# Patient Record
Sex: Female | Born: 1990
Health system: Southern US, Community
[De-identification: ages and names within clinical notes are randomized; demographics above are authoritative.]

## PROBLEM LIST (undated history)

## (undated) DIAGNOSIS — A749 Chlamydial infection, unspecified: Secondary | ICD-10-CM

## (undated) DIAGNOSIS — B999 Unspecified infectious disease: Secondary | ICD-10-CM

## (undated) DIAGNOSIS — F419 Anxiety disorder, unspecified: Secondary | ICD-10-CM

## (undated) DIAGNOSIS — L309 Dermatitis, unspecified: Secondary | ICD-10-CM

## (undated) HISTORY — PX: NO PAST SURGERIES: SHX2092

---

## 2012-02-05 ENCOUNTER — Emergency Department (INDEPENDENT_AMBULATORY_CARE_PROVIDER_SITE_OTHER)
Admission: EM | Admit: 2012-02-05 | Discharge: 2012-02-05 | Disposition: A | Discharge status: Home / Self Care | Payer: PRIVATE HEALTH INSURANCE | Source: Home / Self Care | Attending: Emergency Medicine | Admitting: Emergency Medicine

## 2012-02-05 ENCOUNTER — Encounter (HOSPITAL_COMMUNITY): Payer: Self-pay | Admitting: Emergency Medicine

## 2012-02-05 DIAGNOSIS — N390 Urinary tract infection, site not specified: Secondary | ICD-10-CM

## 2012-02-05 LAB — POCT URINALYSIS DIP (DEVICE)
Glucose, UA: NEGATIVE mg/dL
Protein, ur: >=300 mg/dL — AB
Specific Gravity, Urine: >=1.03 (ref 1.005–1.030)

## 2012-02-05 LAB — POCT PREGNANCY, URINE: Preg Test, Ur: NEGATIVE

## 2012-02-05 MED ORDER — FLUCONAZOLE 150 MG PO TABS: 150.0000 mg | ORAL_TABLET | Freq: Once | ORAL | Status: DC

## 2012-02-05 MED ORDER — NITROFURANTOIN MONOHYD MACRO 100 MG PO CAPS: 100.0000 mg | ORAL_CAPSULE | Freq: Two times a day (BID) | ORAL | Status: DC

## 2012-02-05 NOTE — ED Notes (Addendum)
Pt is here for poss UTI x1 day.... Sx include: dysuria, cloudy urine w/foul odor, hematuria x1 day, dizziness, nauseas and also c/o clear vaginal discharge... Denies: fevers, vomiting, diarrhea, abd/back pain... LMP on 02/02/12... She is alert w/no signs of acute distress.

## 2012-02-05 NOTE — ED Provider Notes (Signed)
History     CSN: [id number]  Arrival date & time 02/05/12  1016   None     Chief Complaint  Patient presents with  . Urinary Tract Infection    (Consider location/radiation/quality/duration/timing/severity/associated sxs/prior treatment) Patient is a 21 y.o. female presenting with urinary tract infection. The history is provided by the patient. No language interpreter was used.  Urinary Tract Infection This is a new problem. The current episode started more than 2 days ago. The problem occurs constantly. The problem has been gradually worsening. Pertinent negatives include no abdominal pain. Nothing aggravates the symptoms. Nothing relieves the symptoms. She has tried nothing for the symptoms.  Pt complains of burning with urination and blood in urine.  Pt also complains of a yeast infection.  No std risk  History reviewed. No pertinent past medical history.  History reviewed. No pertinent past surgical history.  No family history on file.  History  Substance Use Topics  . Smoking status: Never Smoker   . Smokeless tobacco: Not on file  . Alcohol Use: No    OB History    Grav Para Term Preterm Abortions TAB SAB Ect Mult Living                  Review of Systems  Gastrointestinal: Negative for abdominal pain.  All other systems reviewed and are negative.    Allergies  Review of patient's allergies indicates no known allergies.  Home Medications  No current outpatient prescriptions on file.  BP 107/72  Pulse 94  Temp 99.6 F (37.6 C) (Oral)  Resp 16  SpO2 100%  LMP 02/02/2012  Physical Exam  Nursing note and vitals reviewed. Constitutional: She is oriented to person, place, and time. She appears well-developed and well-nourished.  HENT:  Head: Normocephalic and atraumatic.  Eyes: Conjunctivae normal are normal. Pupils are equal, round, and reactive to light.  Neck: Normal range of motion. Neck supple.  Cardiovascular: Normal rate and normal heart  sounds.   Pulmonary/Chest: Effort normal.  Abdominal: Soft.  Musculoskeletal: Normal range of motion.  Neurological: She is alert and oriented to person, place, and time.  Skin: Skin is warm.  Psychiatric: She has a normal mood and affect.    ED Course  Procedures (including critical care time)  Labs Reviewed  POCT URINALYSIS DIP (DEVICE) - Abnormal; Notable for the following:    Bilirubin Urine SMALL (*)     Ketones, ur TRACE (*)     Hgb urine dipstick LARGE (*)     Protein, ur >=300 (*)     Nitrite POSITIVE (*)     Leukocytes, UA SMALL (*)  Biochemical Testing Only. Please order routine urinalysis from main lab if confirmatory testing is needed.   All other components within normal limits  POCT PREGNANCY, URINE   No results found.   1. UTI (lower urinary tract infection)       MDM  Pt given rx for macrobid and diflucan.          Lonia Skinner [place], [place] 02/05/12 [address] [place], [place] 02/05/12 1153

## 2012-02-05 NOTE — ED Provider Notes (Signed)
Medical screening examination/treatment/procedure(s) were performed by non-physician practitioner and as supervising physician I was immediately available for consultation/collaboration.  Raynald Blend, MD 02/05/12 [phone]

## 2013-05-12 ENCOUNTER — Other Ambulatory Visit (HOSPITAL_COMMUNITY): Payer: Self-pay | Admitting: Family Medicine

## 2013-05-12 ENCOUNTER — Encounter (HOSPITAL_COMMUNITY): Payer: Self-pay | Admitting: *Deleted

## 2013-05-12 ENCOUNTER — Inpatient Hospital Stay (HOSPITAL_COMMUNITY)
Admission: AD | Admit: 2013-05-12 | Discharge: 2013-05-12 | Disposition: A | Discharge status: Home / Self Care | Payer: 59 | Source: Ambulatory Visit | Attending: Obstetrics and Gynecology | Admitting: Obstetrics and Gynecology

## 2013-05-12 ENCOUNTER — Ambulatory Visit (HOSPITAL_COMMUNITY)
Admission: RE | Admit: 2013-05-12 | Discharge: 2013-05-12 | Disposition: A | Discharge status: Home / Self Care | Payer: 59 | Source: Ambulatory Visit | Attending: Family Medicine | Admitting: Family Medicine

## 2013-05-12 DIAGNOSIS — R188 Other ascites: Secondary | ICD-10-CM | POA: Insufficient documentation

## 2013-05-12 DIAGNOSIS — O009 Unspecified ectopic pregnancy without intrauterine pregnancy: Secondary | ICD-10-CM

## 2013-05-12 DIAGNOSIS — N949 Unspecified condition associated with female genital organs and menstrual cycle: Secondary | ICD-10-CM | POA: Insufficient documentation

## 2013-05-12 DIAGNOSIS — O9989 Other specified diseases and conditions complicating pregnancy, childbirth and the puerperium: Principal | ICD-10-CM

## 2013-05-12 DIAGNOSIS — O209 Hemorrhage in early pregnancy, unspecified: Secondary | ICD-10-CM | POA: Insufficient documentation

## 2013-05-12 DIAGNOSIS — O200 Threatened abortion: Secondary | ICD-10-CM

## 2013-05-12 DIAGNOSIS — R109 Unspecified abdominal pain: Secondary | ICD-10-CM | POA: Insufficient documentation

## 2013-05-12 DIAGNOSIS — O99891 Other specified diseases and conditions complicating pregnancy: Secondary | ICD-10-CM | POA: Insufficient documentation

## 2013-05-12 DIAGNOSIS — R1031 Right lower quadrant pain: Secondary | ICD-10-CM | POA: Insufficient documentation

## 2013-05-12 HISTORY — DX: Dermatitis, unspecified: L30.9

## 2013-05-12 HISTORY — DX: Unspecified infectious disease: B99.9

## 2013-05-12 HISTORY — DX: Anxiety disorder, unspecified: F41.9

## 2013-05-12 LAB — CBC
HEMATOCRIT: 33.1 % — AB (ref 36.0–46.0)
Hemoglobin: 11.3 g/dL — ABNORMAL LOW (ref 12.0–15.0)
MCH: 30.9 pg (ref 26.0–34.0)
MCHC: 34.1 g/dL (ref 30.0–36.0)
MCV: 90.4 fL (ref 78.0–100.0)
Platelets: 248 10*3/uL (ref 150–400)
RBC: 3.66 MIL/uL — ABNORMAL LOW (ref 3.87–5.11)
RDW: 13 % (ref 11.5–15.5)
WBC: 3.6 10*3/uL — ABNORMAL LOW (ref 4.0–10.5)

## 2013-05-12 LAB — HCG, QUANTITATIVE, PREGNANCY: hCG, Beta Chain, Quant, S: 124 m[IU]/mL — ABNORMAL HIGH (ref <5)

## 2013-05-12 NOTE — Discharge Instructions (Signed)
Return to MAU on Wednesday, March 18 for repeat blood work. Dr Dion BodyVarnado would like to see you in one week in the office, call if you have any questions. Avoid intercourse.   Ectopic Pregnancy Precautions An ectopic pregnancy happens when a fertilized egg grows outside the uterus. A pregnancy cannot live outside of the uterus. This problem often happens in the fallopian tube. It is often caused by damage to the fallopian tube. If this problem is found early, you may be treated with medicine. If your tube tears or bursts open (ruptures), you will bleed inside. This is an emergency. You will need surgery. Get help right away.  SYMPTOMS You may have normal pregnancy symptoms at first. These include:  Missing your period.  Feeling sick to your stomach (nauseous).  Being tired.  Having tender breasts. Then, you may start to have symptoms that are not normal. These include  Bleeding from the vagina. This includes light bleeding (spotting).  Belly (abdomen) or lower belly cramping or pain. This may be felt on one side.  A fast heartbeat (pulse).  Passing out (fainting) after going poop (bowel movement). If your tube tears, you may have symptoms such as:  Really bad pain in the belly or lower belly. This happens suddenly.  Dizziness.  Passing out.  Shoulder pain. GET HELP RIGHT AWAY IF:  You have any of these symptoms. This is an EMERGENCY! Document Released: 05/12/2008 Document Revised: 12/04/2012 Document Reviewed: 09/25/2012 [facility] Patient Information 2014 [employer], [place].

## 2013-05-12 NOTE — MAU Provider Note (Signed)
  History     CSN: [id number]  Arrival date and time: 05/12/13 1226   First Provider Initiated Contact with Patient 05/12/13 1322      Chief Complaint  Patient presents with  . possible ectopic pregnancy    HPI  23 y/o G1 with an unsure LMP, January 2015,  presented to her PCP, Dr. Laurann Montanaynthia White, today with complaint of vaginal spotting with a h/o a positive UPT at home.  Pt was previously on Depo Provera, last injection 09/2012.  Had a period in January and skipped February.  Bleeding started last week.  Pt denies any severe abdominal pain, some menstrual cramping. Dr. Cliffton AstersWhite ordered an ultrasound that showed no IUP and a small mass on the left c/w a possible ectopic pregnancy.  I was asked to evaluate and assume care.  She ordered a quant her office which was pending at the time of consult. Upon arrival, pt comfortable and denied abdominal pain.  Partner present and seems to desire to continue pregnancy if this is an early IUP.  Pt counseled on MTX protocol and verbalizes she can be compliant if this is an ectopic pregnancy.  Also understands possible side effects.  Past Medical History  Diagnosis Date  . Infection     UTI  . Eczema   . Anxiety     no meds    Past Surgical History  Procedure Laterality Date  . No past surgeries      Family History  Problem Relation Age of Onset  . Hypertension Father   . Stroke Maternal Grandfather   . Cancer Paternal Grandmother     lung  . Hearing loss Paternal Grandfather     with age    History  Substance Use Topics  . Smoking status: Never Smoker   . Smokeless tobacco: Never Used  . Alcohol Use: No    Allergies: No Known Allergies  No prescriptions prior to admission    ROS Physical Exam   Blood pressure 108/69, pulse 117, temperature 98.6 F (37 C), temperature source Oral, resp. rate 18, height 5' 0.25" (1.53 m), weight 54.432 kg (120 lb).  Physical Exam Gen:  NAD, A&O x 3 CV:  RRR Lungs:  CTA  bilaterally Abdomen:  Soft, nondistended, no rebound or guarding,  Pelvic:  Scant blood in vault, small clot at cervical os, no active bleeding, cervix closed, uterus mobile, nontender, right mass adjacent to uterus nontender, left adnexa without masses or tenderness MAU Course  Procedures Ultrasound:  No IUP, EMS 5 mm, Small mass, 1.1 cm separate from ovary  Assessment and Plan  Ectopic pregnancy vs. Early IUP-Pt is clinically stable, not in any pain.  Stat quant BHCG ordered in addition to CBC, CMP for MTX protocol.  Type and screen ordered. Pt counseled on MTX protocol with RN present.  All questions answered.  If quant greater than 2500-3000, will recommend MTX.  If quant is low, recommend repeat BHCG in 48 hours. SAB and ectopic precautions given. F/u in my office in 1 week.  Kota Ciancio 05/12/2013, 2:10 PM

## 2013-05-12 NOTE — MAU Note (Addendum)
Sent from office to US, rollover from US. Suspected ectopic preg. Has been having pain in lower abd and vagina; not on one side and currently not in pain. Has had some bleeding started 2wks ago. Once last night and today.

## 2013-05-14 ENCOUNTER — Inpatient Hospital Stay (HOSPITAL_COMMUNITY)
Admission: AD | Admit: 2013-05-14 | Discharge: 2013-05-14 | Disposition: A | Discharge status: Home / Self Care | Payer: 59 | Source: Ambulatory Visit | Attending: Obstetrics & Gynecology | Admitting: Obstetrics & Gynecology

## 2013-05-14 DIAGNOSIS — O209 Hemorrhage in early pregnancy, unspecified: Secondary | ICD-10-CM | POA: Insufficient documentation

## 2013-05-14 LAB — HCG, QUANTITATIVE, PREGNANCY: hCG, Beta Chain, Quant, S: 92 m[IU]/mL — ABNORMAL HIGH (ref <5)

## 2013-05-14 NOTE — MAU Provider Note (Signed)
Chief Complaint:  Follow-up     HPI: Chelsea Small is a 23 y.o. G1P0 with unknown LMP presents for followup quantitative beta hCG. Denies any further abdominal pain. She continues to have slight spotting. Seen here 2 days ago referred from Dr. Laurann Montanaynthia White to Dr. Dion BodyVarnado due to pregnancy with lower abdominal pain and two-week history of abnormal bleeding.     Past Medical History: Past Medical History  Diagnosis Date  . Infection     UTI  . Eczema   . Anxiety     no meds     Past Surgical History: Past Surgical History  Procedure Laterality Date  . No past surgeries       Family History: Family History  Problem Relation Age of Onset  . Hypertension Father   . Stroke Maternal Grandfather   . Cancer Paternal Grandmother     lung  . Hearing loss Paternal Grandfather     with age    Social History: History  Substance Use Topics  . Smoking status: Never Smoker   . Smokeless tobacco: Never Used  . Alcohol Use: No    Allergies: No Known Allergies  Meds:  No prescriptions prior to admission    ROS: Pertinent findings in history of present illness.  Physical Exam  Blood pressure 109/61, pulse 93, temperature 98.4 F (36.9 C), temperature source Oral, resp. rate 18. GENERAL: Well-developed, well-nourished female in no acute distress.  HEENT: normocephalic HEART: normal rate RESP: normal effort ABDOMEN: Soft, non-tender, gravid appropriate for gestational age EXTREMITIES: Nontender, no edema NEURO: alert and oriented  Labs: Results for orders placed during the hospital encounter of 05/14/13 (from the past 24 hour(s))  HCG, QUANTITATIVE, PREGNANCY     Status: Abnormal   Collection Time    05/14/13  1:01 PM      Result Value Ref Range   hCG, Beta Chain, Quant, S 92 (*) <5 mIU/mL   Recent Results (from the past 2160 hour(s))  ABO/RH     Status: None   Collection Time    05/12/13  1:17 PM      Result Value Ref Range   ABO/RH(D) O POS    CBC     Status:  Abnormal   Collection Time    05/12/13  1:17 PM      Result Value Ref Range   WBC 3.6 (*) 4.0 - 10.5 K/uL   RBC 3.66 (*) 3.87 - 5.11 MIL/uL   Hemoglobin 11.3 (*) 12.0 - 15.0 g/dL   HCT 16.133.1 (*) 09.636.0 - 04.546.0 %   MCV 90.4  78.0 - 100.0 fL   MCH 30.9  26.0 - 34.0 pg   MCHC 34.1  30.0 - 36.0 g/dL   RDW 40.913.0  81.111.5 - 91.415.5 %   Platelets 248  150 - 400 K/uL  HCG, QUANTITATIVE, PREGNANCY     Status: Abnormal   Collection Time    05/12/13  1:17 PM      Result Value Ref Range   hCG, Beta Chain, Quant, S 124 (*) <5 mIU/mL   Comment:              GEST. AGE      CONC.  (mIU/mL)       <=1 WEEK        5 - 50         2 WEEKS       50 - 500         3 WEEKS  100 - 10,000         4 WEEKS     1,000 - 30,000         5 WEEKS     3,500 - 115,000       6-8 WEEKS     12,000 - 270,000        12 WEEKS     15,000 - 220,000                FEMALE AND NON-PREGNANT FEMALE:         LESS THAN 5 mIU/mL  HCG, QUANTITATIVE, PREGNANCY     Status: Abnormal   Collection Time    05/14/13  1:01 PM      Result Value Ref Range   hCG, Beta Chain, Quant, S 92 (*) <5 mIU/mL   Comment:              GEST. AGE      CONC.  (mIU/mL)       <=1 WEEK        5 - 50         2 WEEKS       50 - 500         3 WEEKS       100 - 10,000         4 WEEKS     1,000 - 30,000         5 WEEKS     3,500 - 115,000       6-8 WEEKS     12,000 - 270,000        12 WEEKS     15,000 - 220,000                FEMALE AND NON-PREGNANT FEMALE:         LESS THAN 5 mIU/mL    Imaging:  US Ob Comp Less 14 Wks  05/12/2013   CLINICAL DATA:  Early pregnancy, bleeding, right lower quadrant pain. Evaluate for ectopic.  EXAM: OBSTETRIC <14 WK [place] AND TRANSVAGINAL OB US  TECHNIQUE: Both transabdominal and transvaginal ultrasound examinations were performed for complete evaluation of the gestation as well as the maternal uterus, adnexal regions, and pelvic cul-de-sac. Transvaginal technique was performed to assess early pregnancy.  COMPARISON:  None.   FINDINGS: Intrauterine gestational sac: Not visualized.  Maternal uterus/adnexae: Within normal limits. Endometrial complex measures 5 mm.  Right ovary is within normal limits.  Adjacent to the left ovary is a rounded 12 x 10 x 8 mm lesion (image 84) which appears to move independently from the left ovary on cine imaging.  Small volume pelvic ascites.  IMPRESSION: No IUP is visualized.  12 mm rounded lesion in the left adnexa, favored to be distinct from the left ovary, worrisome for ectopic pregnancy. However, this does not correspond to the site of the patient's symptoms. Correlate with beta HCG and consider short-term follow-up imaging in 2-3 days as clinically warranted.  Small volume pelvic ascites, simple.  Critical value/emergent results were called by telephone at the time of interpretation on 05/12/2013 at 12:03 PM to Dr. Laurann Montana , who verbally acknowledged these results.   Electronically Signed   By: Charline Bills M.D.   On: 05/12/2013 12:08   US Ob Transvaginal  05/12/2013   CLINICAL DATA:  Early pregnancy, bleeding, right lower quadrant pain. Evaluate for ectopic.  EXAM: OBSTETRIC <14 WK [place] AND TRANSVAGINAL OB US  TECHNIQUE: Both transabdominal and transvaginal ultrasound examinations  were performed for complete evaluation of the gestation as well as the maternal uterus, adnexal regions, and pelvic cul-de-sac. Transvaginal technique was performed to assess early pregnancy.  COMPARISON:  None.  FINDINGS: Intrauterine gestational sac: Not visualized.  Maternal uterus/adnexae: Within normal limits. Endometrial complex measures 5 mm.  Right ovary is within normal limits.  Adjacent to the left ovary is a rounded 12 x 10 x 8 mm lesion (image 84) which appears to move independently from the left ovary on cine imaging.  Small volume pelvic ascites.  IMPRESSION: No IUP is visualized.  12 mm rounded lesion in the left adnexa, favored to be distinct from the left ovary, worrisome for ectopic pregnancy.  However, this does not correspond to the site of the patient's symptoms. Correlate with beta HCG and consider short-term follow-up imaging in 2-3 days as clinically warranted.  Small volume pelvic ascites, simple.  Critical value/emergent results were called by telephone at the time of interpretation on 05/12/2013 at 12:03 PM to Dr. Laurann Montana , who verbally acknowledged these results.   Electronically Signed   By: Charline Bills M.D.   On: 05/12/2013 12:08   MAU Course: C/W Dr. Dion Body  Assessment: 1. Bleeding in early pregnancy   Fa;;ing quants consistent with failed pregnancy; suspicious for left ectopic by [place]  Plan: Discharge home with ectopic precautions     Medication List    Notice   You have not been prescribed any medications.     Follow-up Information   Follow up with Geryl Rankins, MD On 05/19/2013.   Specialty:  Obstetrics and Gynecology   Contact information:   [address] [place] [address] [phone]        Danae Orleans, CNM 05/14/2013 2:42 PM

## 2013-05-14 NOTE — Discharge Instructions (Signed)
Ectopic Pregnancy °An ectopic pregnancy is when the fertilized egg attaches (implants) outside the uterus. Most ectopic pregnancies occur in the fallopian tube. Rarely do ectopic pregnancies occur on the ovary, intestine, pelvis, or cervix. In an ectopic pregnancy, the fertilized egg does not have the ability to develop into a normal, healthy baby.  °A ruptured ectopic pregnancy is one in which the fallopian tube gets torn or bursts and results in internal bleeding. Often there is intense abdominal pain, and sometimes, vaginal bleeding. Having an ectopic pregnancy can be life threatening. If left untreated, this dangerous condition can lead to a blood transfusion, abdominal surgery, or even death. °CAUSES  °Damage to the fallopian tubes is the suspected cause in most ectopic pregnancies.  °RISK FACTORS °Depending on your circumstances, the risk of having an ectopic pregnancy will vary. The level of risk can be divided into three categories. °High Risk °· You have gone through infertility treatment. °· You have had a previous ectopic pregnancy. °· You have had previous tubal surgery. °· You have had previous surgery to have the fallopian tubes tied (tubal ligation). °· You have tubal problems or diseases. °· You have been exposed to DES. DES is a medicine that was used until 1971 and had effects on babies whose mothers took the medicine. °· You become pregnant while using an intrauterine device (IUD) for birth control.  °Moderate Risk °· You have a history of infertility. °· You have a history of a sexually transmitted infection (STI). °· You have a history of pelvic inflammatory disease (PID). °· You have scarring from endometriosis. °· You have multiple sexual partners. °· You smoke.  °Low Risk °· You have had previous pelvic surgery. °· You use vaginal douching. °· You became sexually active before 21 years of age. °SIGNS AND SYMPTOMS  °An ectopic pregnancy should be suspected in anyone who has missed a period and  has abdominal pain or bleeding. °· You may experience normal pregnancy symptoms, such as: °· Nausea. °· Tiredness. °· Breast tenderness. °· Other symptoms may include: °· Pain with intercourse. °· Irregular vaginal bleeding or spotting. °· Cramping or pain on one side or in the lower abdomen. °· Fast heartbeat. °· Passing out while having a bowel movement. °· Symptoms of a ruptured ectopic pregnancy and internal bleeding may include: °· Sudden, severe pain in the abdomen and pelvis. °· Dizziness or fainting. °· Pain in the shoulder area. °DIAGNOSIS  °Tests that may be performed include: °· A pregnancy test. °· An ultrasound test. °· Testing the specific level of pregnancy hormone in the bloodstream. °· Taking a sample of uterus tissue (dilation and curettage, D&C). °· Surgery to perform a visual exam of the inside of the abdomen using a thin, lighted tube with a tiny camera on the end (laparoscope). °TREATMENT  °An injection of a medicine called methotrexate may be given. This medicine causes the pregnancy tissue to be absorbed. It is given if: °· The diagnosis is made early. °· The fallopian tube has not ruptured. °· You are considered to be a good candidate for the medicine. °Usually, pregnancy hormone blood levels are checked after methotrexate treatment. This is to be sure the medicine is effective. It may take 4 6 weeks for the pregnancy to be absorbed (though most pregnancies will be absorbed by 3 weeks). °Surgical treatment may be needed. A laparoscope may be used to remove the pregnancy tissue. If severe internal bleeding occurs, a cut (incision) may be made in the lower abdomen (laparotomy), and the   ectopic pregnancy is removed. This stops the bleeding. Part of the fallopian tube, or the whole tube, may be removed as well (salpingectomy). After surgery, pregnancy hormone tests may be done to be sure there is no pregnancy tissue left. You may receive an Rho(D) immune globulin shot if you are Rh negative and  the father is Rh positive, or if you do not know the Rh type of the father. This is to prevent problems with any future pregnancy. °SEEK IMMEDIATE MEDICAL CARE IF:  °You have any symptoms of an ectopic pregnancy. This is a medical emergency. °Document Released: 03/23/2004 Document Revised: 12/04/2012 Document Reviewed: 09/12/2012 °Exit[employer]® Patient Information ©2014 [employer]. ° °

## 2013-05-14 NOTE — MAU Note (Signed)
Pt here for repeat BHCG.  Denies pain, is having spotting, also diarrhea which started Monday.

## 2013-05-16 ENCOUNTER — Inpatient Hospital Stay (HOSPITAL_COMMUNITY)
Admission: AD | Admit: 2013-05-16 | Discharge: 2013-05-16 | Disposition: A | Discharge status: Home / Self Care | Payer: PRIVATE HEALTH INSURANCE | Source: Ambulatory Visit | Attending: Obstetrics and Gynecology | Admitting: Obstetrics and Gynecology

## 2013-05-16 NOTE — MAU Note (Signed)
Patient states she had an appointment to meet with Dr. Dion BodyVarnado today at 1530. Phone call to the office and patient was scheduled to meet her in her office today, not in MAU. Call made to the office and they will see her now. Directions and address and phone number given to the patient.

## 2013-12-29 ENCOUNTER — Encounter (HOSPITAL_COMMUNITY): Payer: Self-pay | Admitting: *Deleted

## 2014-02-04 ENCOUNTER — Encounter (HOSPITAL_COMMUNITY): Payer: Self-pay

## 2014-02-04 ENCOUNTER — Emergency Department (HOSPITAL_COMMUNITY)
Admission: EM | Admit: 2014-02-04 | Discharge: 2014-02-04 | Disposition: A | Discharge status: Home / Self Care | Payer: 59 | Attending: Emergency Medicine | Admitting: Emergency Medicine

## 2014-02-04 ENCOUNTER — Emergency Department (HOSPITAL_COMMUNITY): Payer: 59

## 2014-02-04 DIAGNOSIS — O99611 Diseases of the digestive system complicating pregnancy, first trimester: Secondary | ICD-10-CM | POA: Insufficient documentation

## 2014-02-04 DIAGNOSIS — Z87440 Personal history of urinary (tract) infections: Secondary | ICD-10-CM | POA: Insufficient documentation

## 2014-02-04 DIAGNOSIS — R748 Abnormal levels of other serum enzymes: Secondary | ICD-10-CM | POA: Diagnosis not present

## 2014-02-04 DIAGNOSIS — Z8659 Personal history of other mental and behavioral disorders: Secondary | ICD-10-CM | POA: Diagnosis not present

## 2014-02-04 DIAGNOSIS — O210 Mild hyperemesis gravidarum: Secondary | ICD-10-CM | POA: Insufficient documentation

## 2014-02-04 DIAGNOSIS — R1010 Upper abdominal pain, unspecified: Secondary | ICD-10-CM | POA: Insufficient documentation

## 2014-02-04 DIAGNOSIS — O219 Vomiting of pregnancy, unspecified: Secondary | ICD-10-CM

## 2014-02-04 DIAGNOSIS — Z3A00 Weeks of gestation of pregnancy not specified: Secondary | ICD-10-CM | POA: Diagnosis not present

## 2014-02-04 DIAGNOSIS — K5900 Constipation, unspecified: Secondary | ICD-10-CM | POA: Insufficient documentation

## 2014-02-04 DIAGNOSIS — O9989 Other specified diseases and conditions complicating pregnancy, childbirth and the puerperium: Secondary | ICD-10-CM | POA: Insufficient documentation

## 2014-02-04 DIAGNOSIS — Z872 Personal history of diseases of the skin and subcutaneous tissue: Secondary | ICD-10-CM | POA: Insufficient documentation

## 2014-02-04 LAB — URINALYSIS, ROUTINE W REFLEX MICROSCOPIC
BILIRUBIN URINE: NEGATIVE
Glucose, UA: NEGATIVE mg/dL
HGB URINE DIPSTICK: NEGATIVE
Leukocytes, UA: NEGATIVE
NITRITE: NEGATIVE
PROTEIN: 30 mg/dL — AB
SPECIFIC GRAVITY, URINE: 1.029 (ref 1.005–1.030)
UROBILINOGEN UA: 1 mg/dL (ref 0.0–1.0)
pH: 5.5 (ref 5.0–8.0)

## 2014-02-04 LAB — URINE MICROSCOPIC-ADD ON

## 2014-02-04 LAB — COMPREHENSIVE METABOLIC PANEL
ALT: 9 U/L (ref 0–35)
ANION GAP: 19 — AB (ref 5–15)
AST: 15 U/L (ref 0–37)
Albumin: 4.2 g/dL (ref 3.5–5.2)
Alkaline Phosphatase: 47 U/L (ref 39–117)
BILIRUBIN TOTAL: 0.9 mg/dL (ref 0.3–1.2)
BUN: 12 mg/dL (ref 6–23)
CALCIUM: 9.8 mg/dL (ref 8.4–10.5)
CO2: 16 meq/L — AB (ref 19–32)
CREATININE: 0.56 mg/dL (ref 0.50–1.10)
Chloride: 98 mEq/L (ref 96–112)
Glucose, Bld: 88 mg/dL (ref 70–99)
Potassium: 3.6 mEq/L — ABNORMAL LOW (ref 3.7–5.3)
Sodium: 133 mEq/L — ABNORMAL LOW (ref 137–147)
Total Protein: 8.9 g/dL — ABNORMAL HIGH (ref 6.0–8.3)

## 2014-02-04 LAB — CBC WITH DIFFERENTIAL/PLATELET
BASOS ABS: 0 10*3/uL (ref 0.0–0.1)
Basophils Relative: 0 % (ref 0–1)
EOS PCT: 0 % (ref 0–5)
Eosinophils Absolute: 0 10*3/uL (ref 0.0–0.7)
HEMATOCRIT: 34.6 % — AB (ref 36.0–46.0)
HEMOGLOBIN: 12.4 g/dL (ref 12.0–15.0)
LYMPHS PCT: 15 % (ref 12–46)
Lymphs Abs: 1.1 10*3/uL (ref 0.7–4.0)
MCH: 31.7 pg (ref 26.0–34.0)
MCHC: 35.8 g/dL (ref 30.0–36.0)
MCV: 88.5 fL (ref 78.0–100.0)
MONO ABS: 0.9 10*3/uL (ref 0.1–1.0)
MONOS PCT: 12 % (ref 3–12)
Neutro Abs: 5.5 10*3/uL (ref 1.7–7.7)
Neutrophils Relative %: 73 % (ref 43–77)
Platelets: 270 10*3/uL (ref 150–400)
RBC: 3.91 MIL/uL (ref 3.87–5.11)
RDW: 12.8 % (ref 11.5–15.5)
WBC: 7.6 10*3/uL (ref 4.0–10.5)

## 2014-02-04 LAB — PREGNANCY, URINE: PREG TEST UR: POSITIVE — AB

## 2014-02-04 LAB — LIPASE, BLOOD: LIPASE: 138 U/L — AB (ref 11–59)

## 2014-02-04 MED ORDER — ONDANSETRON HCL 4 MG/2ML IJ SOLN
4.0000 mg | Freq: Once | INTRAMUSCULAR | Status: AC
  Administered 2014-02-04: 4 mg via INTRAVENOUS
  Filled 2014-02-04: qty 2

## 2014-02-04 MED ORDER — SODIUM CHLORIDE 0.9 % IV BOLUS (SEPSIS)
1000.0000 mL | Freq: Once | INTRAVENOUS | Status: AC
  Administered 2014-02-04: 1000 mL via INTRAVENOUS

## 2014-02-04 MED ORDER — METOCLOPRAMIDE HCL 10 MG PO TABS: 10.0000 mg | ORAL_TABLET | Freq: Four times a day (QID) | ORAL | Status: DC | PRN

## 2014-02-04 MED ORDER — PROMETHAZINE HCL 25 MG RE SUPP: 25.0000 mg | Freq: Four times a day (QID) | RECTAL | Status: DC | PRN

## 2014-02-04 MED ORDER — HYDROCODONE-ACETAMINOPHEN 5-325 MG PO TABS: 2.0000 | ORAL_TABLET | ORAL | Status: DC | PRN

## 2014-02-04 NOTE — ED Notes (Signed)
Pt presents with nausea, vomiting, and abd pain x2 weeks. Pt states her symptoms started after eating Chipotle

## 2014-02-04 NOTE — ED Provider Notes (Signed)
CSN: [id number]     Arrival date & time 02/04/14  [id number] History   This chart was scribe for Chelsea Raceravid Imoni Kohen, MD by Angelene GiovanniEmmanuella Mensah, ED Scribe. The patient was seen in room A10C/A10C and the patient's care was started at 3:45 AM.     Chief Complaint  Patient presents with  . Abdominal Pain   Patient is a 23 y.o. female presenting with abdominal pain. The history is provided by the patient. No language interpreter was used.  Abdominal Pain Pain location:  Generalized Pain radiates to:  Does not radiate Onset quality:  Gradual Duration:  1 day Timing:  Intermittent Associated symptoms: constipation and vomiting   Associated symptoms: no fever    HPI Comments: Chelsea Small is a 23 y.o. female who presents to the Emergency Department complaining of several episodes of vomiting daily onset 01/13/2014. She reports that she vomits after she eats and drinks. She states that tonight there was an acute intermittent sharp pain in her abdomen. She reports associated constipation and she is not sure when her last BM was. She states that she took Miralax with no relief. She reports that she had [employer] before onset and she had diarrhea for a couple of days but that turned into constipation. Her LMP was early October.   Past Medical History  Diagnosis Date  . Infection     UTI  . Eczema   . Anxiety     no meds   Past Surgical History  Procedure Laterality Date  . No past surgeries     Family History  Problem Relation Age of Onset  . Hypertension Father   . Stroke Maternal Grandfather   . Cancer Paternal Grandmother     lung  . Hearing loss Paternal Grandfather     with age   History  Substance Use Topics  . Smoking status: Never Smoker   . Smokeless tobacco: Never Used  . Alcohol Use: No   OB History    Gravida Para Term Preterm AB TAB SAB Ectopic Multiple Living   1              Review of Systems  Constitutional: Negative for fever.  Gastrointestinal: Positive for vomiting,  abdominal pain and constipation.  All other systems reviewed and are negative.     Allergies  Review of patient's allergies indicates no known allergies.  Home Medications   Prior to Admission medications   Medication Sig Start Date End Date Taking? Authorizing Provider  ondansetron (ZOFRAN-ODT) 4 MG disintegrating tablet Take 4 mg by mouth every 8 (eight) hours as needed for nausea.  02/01/14  Yes Historical Provider, MD  HYDROcodone-acetaminophen (NORCO) 5-325 MG per tablet Take 2 tablets by mouth every 4 (four) hours as needed. 02/04/14   Chelsea Raceravid Cass Edinger, MD  metoCLOPramide (REGLAN) 10 MG tablet Take 1 tablet (10 mg total) by mouth every 6 (six) hours as needed for nausea or vomiting (nausea/headache). 02/04/14   Chelsea Raceravid Islam Villescas, MD  promethazine (PHENERGAN) 25 MG suppository Place 1 suppository (25 mg total) rectally every 6 (six) hours as needed for nausea or vomiting. 02/04/14   Chelsea Raceravid Venus Ruhe, MD   BP 97/65 mmHg  Pulse 87  Temp(Src) 98.6 F (37 C) (Oral)  Resp 17  Ht 5\' 1"  (1.549 m)  Wt 112 lb (50.803 kg)  BMI 21.17 kg/m2  SpO2 100%  LMP 11/30/2013  Breastfeeding? Unknown Physical Exam  ED Course  Procedures (including critical care time) DIAGNOSTIC STUDIES: Oxygen Saturation is 100% on RA, normal  by my interpretation.    COORDINATION OF CARE: 3:52 AM- Pt advised of plan for treatment and pt agrees.    Labs Review Labs Reviewed  CBC WITH DIFFERENTIAL - Abnormal; Notable for the following:    HCT 34.6 (*)    All other components within normal limits  COMPREHENSIVE METABOLIC PANEL - Abnormal; Notable for the following:    Sodium 133 (*)    Potassium 3.6 (*)    CO2 16 (*)    Total Protein 8.9 (*)    Anion gap 19 (*)    All other components within normal limits  LIPASE, BLOOD - Abnormal; Notable for the following:    Lipase 138 (*)    All other components within normal limits  PREGNANCY, URINE - Abnormal; Notable for the following:    Preg Test, Ur POSITIVE (*)     All other components within normal limits  URINALYSIS, ROUTINE W REFLEX MICROSCOPIC - Abnormal; Notable for the following:    APPearance CLOUDY (*)    Ketones, ur >80 (*)    Protein, ur 30 (*)    All other components within normal limits  URINE MICROSCOPIC-ADD ON    Imaging Review [place] Abdomen Complete  02/04/2014   CLINICAL DATA:  Mid upper abdominal pain with nausea and vomiting for 3 weeks.  EXAM: ULTRASOUND ABDOMEN COMPLETE  COMPARISON:  None.  FINDINGS: Gallbladder: No gallstones or wall thickening visualized. No sonographic Murphy sign noted.  Common bile duct: Diameter: 3.1 mm, normal  Liver: No focal lesion identified. Within normal limits in parenchymal echogenicity.  IVC: No abnormality visualized.  Pancreas: Visualized portion unremarkable.  Spleen: Size and appearance within normal limits.  Right Kidney: Length: 10.8 cm. Echogenicity within normal limits. No mass or hydronephrosis visualized.  Left Kidney: Length: 10.1 cm. Echogenicity within normal limits. No mass or hydronephrosis visualized.  Abdominal aorta: No aneurysm visualized.  Other findings: None.  IMPRESSION: Normal examination.  No acute process identified.   Electronically Signed   By: Burman NievesWilliam  Stevens M.D.   On: 02/04/2014 06:17     EKG Interpretation None      MDM   Final diagnoses:  Upper abdominal pain  Vomiting of pregnancy  Elevated lipase      I personally performed the services described in this documentation, which was scribed in my presence. The recorded information has been reviewed and is accurate.    Patient with no further abdominal pain or vomiting in the emergency department. Vital signs stable. Mild elevation of lipase of uncertain significance. No gallstones visualized on ultrasound. Positive pregnancy test. States she hasn't OB and will follow-up. Patient continues to deny any vaginal bleeding or lower abdominal pain. Return precautions have been given.  Chelsea Raceravid Dontel Harshberger, MD 02/04/14  [phone]

## 2014-02-04 NOTE — Discharge Instructions (Signed)
Hyperemesis Gravidarum °Hyperemesis gravidarum is a severe form of nausea and vomiting that happens during pregnancy. Hyperemesis is worse than morning sickness. It may cause you to have nausea or vomiting all day for many days. It may keep you from eating and drinking enough food and liquids. Hyperemesis usually occurs during the first half (the first 20 weeks) of pregnancy. It often goes away once a woman is in her second half of pregnancy. However, sometimes hyperemesis continues through an entire pregnancy.  °CAUSES  °The cause of this condition is not completely known but is thought to be related to changes in the body's hormones when pregnant. It could be from the high level of the pregnancy hormone or an increase in estrogen in the body.  °SIGNS AND SYMPTOMS  °· Severe nausea and vomiting. °· Nausea that does not go away. °· Vomiting that does not allow you to keep any food down. °· Weight loss and body fluid loss (dehydration). °· Having no desire to eat or not liking food you have previously enjoyed. °DIAGNOSIS  °Your health care provider will do a physical exam and ask you about your symptoms. He or she may also order blood tests and urine tests to make sure something else is not causing the problem.  °TREATMENT  °You may only need medicine to control the problem. If medicines do not control the nausea and vomiting, you will be treated in the hospital to prevent dehydration, increased acid in the blood (acidosis), weight loss, and changes in the electrolytes in your body that may harm the unborn baby (fetus). You may need IV fluids.  °HOME CARE INSTRUCTIONS  °· Only take over-the-counter or prescription medicines as directed by your health care provider. °· Try eating a couple of dry crackers or toast in the morning before getting out of bed. °· Avoid foods and smells that upset your stomach. °· Avoid fatty and spicy foods. °· Eat 5-6 small meals a day. °· Do not drink when eating meals. Drink between  meals. °· For snacks, eat high-protein foods, such as cheese. °· Eat or suck on things that have ginger in them. Ginger helps nausea. °· Avoid food preparation. The smell of food can spoil your appetite. °· Avoid iron pills and iron in your multivitamins until after 3-4 months of being pregnant. However, consult with your health care provider before stopping any prescribed iron pills. °SEEK MEDICAL CARE IF:  °· Your abdominal pain increases. °· You have a severe headache. °· You have vision problems. °· You are losing weight. °SEEK IMMEDIATE MEDICAL CARE IF:  °· You are unable to keep fluids down. °· You vomit blood. °· You have constant nausea and vomiting. °· You have excessive weakness. °· You have extreme thirst. °· You have dizziness or fainting. °· You have a fever or persistent symptoms for more than 2-3 days. °· You have a fever and your symptoms suddenly get worse. °MAKE SURE YOU:  °· Understand these instructions. °· Will watch your condition. °· Will get help right away if you are not doing well or get worse. °Document Released: 02/13/2005 Document Revised: 12/04/2012 Document Reviewed: 09/25/2012 °Exit[employer]® Patient Information ©2015 [employer]. This information is not intended to replace advice given to you by your health care provider. Make sure you discuss any questions you have with your health care provider. ° °

## 2014-02-04 NOTE — ED Notes (Signed)
Patient transported to Ultrasound 

## 2014-02-11 ENCOUNTER — Inpatient Hospital Stay (HOSPITAL_COMMUNITY)
Admission: AD | Admit: 2014-02-11 | Discharge: 2014-02-14 | DRG: 781 | Disposition: A | Discharge status: Home / Self Care | Payer: 59 | Source: Ambulatory Visit | Attending: Obstetrics and Gynecology | Admitting: Obstetrics and Gynecology

## 2014-02-11 ENCOUNTER — Encounter (HOSPITAL_COMMUNITY): Payer: Self-pay

## 2014-02-11 ENCOUNTER — Other Ambulatory Visit: Payer: Self-pay | Admitting: Obstetrics and Gynecology

## 2014-02-11 ENCOUNTER — Observation Stay (HOSPITAL_COMMUNITY): Payer: 59

## 2014-02-11 DIAGNOSIS — R1110 Vomiting, unspecified: Secondary | ICD-10-CM

## 2014-02-11 DIAGNOSIS — E876 Hypokalemia: Secondary | ICD-10-CM | POA: Diagnosis present

## 2014-02-11 DIAGNOSIS — O26891 Other specified pregnancy related conditions, first trimester: Secondary | ICD-10-CM | POA: Diagnosis present

## 2014-02-11 DIAGNOSIS — R112 Nausea with vomiting, unspecified: Secondary | ICD-10-CM | POA: Diagnosis present

## 2014-02-11 DIAGNOSIS — O211 Hyperemesis gravidarum with metabolic disturbance: Principal | ICD-10-CM | POA: Diagnosis present

## 2014-02-11 DIAGNOSIS — Z3A11 11 weeks gestation of pregnancy: Secondary | ICD-10-CM | POA: Diagnosis present

## 2014-02-11 DIAGNOSIS — O210 Mild hyperemesis gravidarum: Secondary | ICD-10-CM

## 2014-02-11 DIAGNOSIS — R1013 Epigastric pain: Secondary | ICD-10-CM | POA: Diagnosis present

## 2014-02-11 DIAGNOSIS — R748 Abnormal levels of other serum enzymes: Secondary | ICD-10-CM | POA: Diagnosis present

## 2014-02-11 LAB — CBC
HCT: 32.9 % — ABNORMAL LOW (ref 36.0–46.0)
Hemoglobin: 11.6 g/dL — ABNORMAL LOW (ref 12.0–15.0)
MCH: 31 pg (ref 26.0–34.0)
MCHC: 35.3 g/dL (ref 30.0–36.0)
MCV: 88 fL (ref 78.0–100.0)
Platelets: 263 10*3/uL (ref 150–400)
RBC: 3.74 MIL/uL — ABNORMAL LOW (ref 3.87–5.11)
RDW: 12.6 % (ref 11.5–15.5)
WBC: 9.9 10*3/uL (ref 4.0–10.5)

## 2014-02-11 LAB — URINALYSIS, ROUTINE W REFLEX MICROSCOPIC
Bilirubin Urine: NEGATIVE
GLUCOSE, UA: NEGATIVE mg/dL
Ketones, ur: >80 mg/dL — AB
Leukocytes, UA: NEGATIVE
Nitrite: NEGATIVE
PROTEIN: 30 mg/dL — AB
Specific Gravity, Urine: >1.03 — ABNORMAL HIGH (ref 1.005–1.030)
UROBILINOGEN UA: 1 mg/dL (ref 0.0–1.0)
pH: 6 (ref 5.0–8.0)

## 2014-02-11 LAB — COMPREHENSIVE METABOLIC PANEL
ALK PHOS: 48 U/L (ref 39–117)
ALT: 21 U/L (ref 0–35)
ANION GAP: 22 — AB (ref 5–15)
AST: 19 U/L (ref 0–37)
Albumin: 4 g/dL (ref 3.5–5.2)
BUN: 14 mg/dL (ref 6–23)
CO2: 16 mEq/L — ABNORMAL LOW (ref 19–32)
Calcium: 9.6 mg/dL (ref 8.4–10.5)
Chloride: 95 mEq/L — ABNORMAL LOW (ref 96–112)
Creatinine, Ser: 0.5 mg/dL (ref 0.50–1.10)
GFR calc Af Amer: >90 mL/min (ref >90)
GFR calc non Af Amer: >90 mL/min (ref >90)
Glucose, Bld: 82 mg/dL (ref 70–99)
POTASSIUM: 3.1 meq/L — AB (ref 3.7–5.3)
SODIUM: 133 meq/L — AB (ref 137–147)
TOTAL PROTEIN: 8 g/dL (ref 6.0–8.3)
Total Bilirubin: 0.5 mg/dL (ref 0.3–1.2)

## 2014-02-11 LAB — HCG, QUANTITATIVE, PREGNANCY: HCG, BETA CHAIN, QUANT, S: 297508 m[IU]/mL — AB (ref <5)

## 2014-02-11 LAB — URINE MICROSCOPIC-ADD ON

## 2014-02-11 LAB — LIPASE, BLOOD: Lipase: 250 U/L — ABNORMAL HIGH (ref 11–59)

## 2014-02-11 LAB — AMYLASE: Amylase: 204 U/L — ABNORMAL HIGH (ref 0–105)

## 2014-02-11 MED ORDER — DEXTROSE IN LACTATED RINGERS 5 % IV SOLN
INTRAVENOUS | Status: DC
  Administered 2014-02-11 – 2014-02-13 (×3): via INTRAVENOUS

## 2014-02-11 MED ORDER — LACTATED RINGERS IV BOLUS (SEPSIS)
1000.0000 mL | Freq: Once | INTRAVENOUS | Status: AC
  Administered 2014-02-11: 1000 mL via INTRAVENOUS

## 2014-02-11 MED ORDER — PROMETHAZINE HCL 25 MG PO TABS
12.5000 mg | ORAL_TABLET | ORAL | Status: DC | PRN
  Filled 2014-02-11: qty 1

## 2014-02-11 MED ORDER — PROMETHAZINE HCL 25 MG/ML IJ SOLN
12.5000 mg | INTRAMUSCULAR | Status: DC | PRN
  Administered 2014-02-13: 12.5 mg via INTRAVENOUS
  Filled 2014-02-11: qty 1

## 2014-02-11 MED ORDER — ONDANSETRON 8 MG/NS 50 ML IVPB
8.0000 mg | Freq: Three times a day (TID) | INTRAVENOUS | Status: DC
  Administered 2014-02-11 – 2014-02-13 (×7): 8 mg via INTRAVENOUS
  Filled 2014-02-11 (×8): qty 8

## 2014-02-11 MED ORDER — PROMETHAZINE HCL 25 MG RE SUPP
25.0000 mg | Freq: Four times a day (QID) | RECTAL | Status: DC | PRN
Start: 2014-02-11 — End: 2014-02-14

## 2014-02-11 MED ORDER — PRENATAL MULTIVITAMIN CH: 1.0000 | ORAL_TABLET | Freq: Every day | ORAL | Status: DC

## 2014-02-11 MED ORDER — FAMOTIDINE IN NACL 20-0.9 MG/50ML-% IV SOLN
20.0000 mg | Freq: Two times a day (BID) | INTRAVENOUS | Status: DC
  Administered 2014-02-11 – 2014-02-13 (×4): 20 mg via INTRAVENOUS
  Filled 2014-02-11 (×5): qty 50

## 2014-02-11 MED ORDER — PYRIDOXINE HCL 100 MG/ML IJ SOLN
100.0000 mg | Freq: Every day | INTRAMUSCULAR | Status: DC
  Administered 2014-02-11 – 2014-02-13 (×3): 100 mg via INTRAVENOUS
  Filled 2014-02-11 (×4): qty 1

## 2014-02-11 NOTE — H&P (Signed)
Chelsea Small is a 23 y.o. female G2P0010 with an unknown LMP is admitted for persistant N/V, uncontrolled with medication. Other than cereal this morning, she has not been able to hold down liquids or solids.  Failed Zofran.  Reglan was helpful for 3-4 days but vomiting resumed.  Vomiting 3 times daily.  Pt has lost 10 pounds in 1 week.  Pt feels weak.  Pt can barely walk, using wheelchair.  Pt states she feels better with IVF.  Nausea has decreased with IV Zofran but still has a little.  Would like to eat. Pt has had N/V x 1 month.  She missed a period took a UPT which was positive.  2 other home UPTs were negative at the same time.  [facility] and UPT was also negative.  She was given Zofran without much improvement.  Pt was seen in ER and UPT was faintly +.  Pt reports they test was repeat at least 3 times b/c it was inconclusive. Pt's last pregnancy was early SAB vs. Ectopic.  She has a h/o irregular menses.  History OB History    Gravida Para Term Preterm AB TAB SAB Ectopic Multiple Living   1              Past Medical History  Diagnosis Date  . Infection     UTI  . Eczema   . Anxiety     no meds   Past Surgical History  Procedure Laterality Date  . No past surgeries     Family History: family history includes Cancer in her paternal grandmother; Hearing loss in her paternal grandfather; Hypertension in her father; Stroke in her maternal grandfather. Social History:  reports that she has never smoked. She has never used smokeless tobacco. She reports that she does not drink alcohol or use illicit drugs.    ROS    Blood pressure 98/56, pulse 97, temperature 99.6 F (37.6 C), temperature source Oral, resp. rate 18, height 5\' 1"  (1.549 m), weight 44.566 kg (98 lb 4 oz), last menstrual period 11/30/2013, SpO2 100 %, unknown if currently breastfeeding. Maternal Exam:  Abdomen: Patient reports the following abdominal tenderness: epigastric.  Introitus:  not evaluated.     Physical Exam  Constitutional: She is oriented to person, place, and time. She appears well-developed and well-nourished.  Pt appears weak and dehydrated. Dizzy. Requires ambulance.  HENT:  Head: Normocephalic and atraumatic.  Lips dry and cracked.  Eyes: EOM are normal.  Neck: Normal range of motion.  Cardiovascular: Regular rhythm and normal heart sounds.   No murmur heard. Tachycardic.  Respiratory: Effort normal and breath sounds normal. No respiratory distress. She has no wheezes.  Increased respirations.  GI: There is tenderness in the epigastric area.  Musculoskeletal: Normal range of motion. She exhibits no edema.  Neurological: She is alert and oriented to person, place, and time.  Skin: Skin is warm and dry. She is not diaphoretic.  Psychiatric: She has a normal mood and affect.   Quant BHCG 297,000 Amylase/Lipase elevated  CMP pending. CBC wnl UA ketone >80   Assessment/Plan: Pregnancy with Hyperemesis Gravidarum Significantly dehydrated. Likely with electrolyte abnormalities. High HCG with possible early pregnancy. Epigastric pain with Amylase/Lipase elevated likely due to excessive vomiting.  Follow trend.  If increasing, consult IM for possible pancreatitis.   S/p IVF Bolus.  Cont IVF until negative urine ketones. IV Zofran, Pepcid, Vit B6.  Phenergan IV/PO/PR prn.  Add Reglan prn. NPO  until nausea has resolved. Ultrasound to determine IUP, gestational age, r/o molar pregnancy, multiple gestation. Replace electrolytes PRN. Repeat BMP, Amylase/Lipase, UA for ketones in am.    Helton Oleson 02/11/2014, 10:55 PM

## 2014-02-12 DIAGNOSIS — O210 Mild hyperemesis gravidarum: Secondary | ICD-10-CM

## 2014-02-12 DIAGNOSIS — R1013 Epigastric pain: Secondary | ICD-10-CM | POA: Diagnosis present

## 2014-02-12 DIAGNOSIS — E876 Hypokalemia: Secondary | ICD-10-CM | POA: Diagnosis present

## 2014-02-12 DIAGNOSIS — R748 Abnormal levels of other serum enzymes: Secondary | ICD-10-CM | POA: Diagnosis present

## 2014-02-12 DIAGNOSIS — Z3A11 11 weeks gestation of pregnancy: Secondary | ICD-10-CM | POA: Diagnosis present

## 2014-02-12 DIAGNOSIS — Z3481 Encounter for supervision of other normal pregnancy, first trimester: Secondary | ICD-10-CM | POA: Diagnosis present

## 2014-02-12 DIAGNOSIS — O26891 Other specified pregnancy related conditions, first trimester: Secondary | ICD-10-CM | POA: Diagnosis present

## 2014-02-12 DIAGNOSIS — O211 Hyperemesis gravidarum with metabolic disturbance: Secondary | ICD-10-CM | POA: Diagnosis present

## 2014-02-12 DIAGNOSIS — R112 Nausea with vomiting, unspecified: Secondary | ICD-10-CM | POA: Diagnosis present

## 2014-02-12 LAB — URINALYSIS, DIPSTICK ONLY
BILIRUBIN URINE: NEGATIVE
Bilirubin Urine: NEGATIVE
GLUCOSE, UA: NEGATIVE mg/dL
Glucose, UA: 250 mg/dL — AB
Glucose, UA: 100 mg/dL — AB
Hgb urine dipstick: NEGATIVE
Ketones, ur: 40 mg/dL — AB
Ketones, ur: 40 mg/dL — AB
Ketones, ur: NEGATIVE mg/dL
LEUKOCYTES UA: NEGATIVE
Leukocytes, UA: NEGATIVE
NITRITE: NEGATIVE
NITRITE: NEGATIVE
Nitrite: NEGATIVE
PROTEIN: NEGATIVE mg/dL
PROTEIN: NEGATIVE mg/dL
Protein, ur: NEGATIVE mg/dL
Specific Gravity, Urine: >1.03 — ABNORMAL HIGH (ref 1.005–1.030)
Specific Gravity, Urine: >1.03 — ABNORMAL HIGH (ref 1.005–1.030)
UROBILINOGEN UA: 4 mg/dL — AB (ref 0.0–1.0)
Urobilinogen, UA: 4 mg/dL — ABNORMAL HIGH (ref 0.0–1.0)
Urobilinogen, UA: 4 mg/dL — ABNORMAL HIGH (ref 0.0–1.0)
pH: 6 (ref 5.0–8.0)
pH: 6 (ref 5.0–8.0)
pH: 6 (ref 5.0–8.0)

## 2014-02-12 LAB — URINALYSIS, ROUTINE W REFLEX MICROSCOPIC
Glucose, UA: NEGATIVE mg/dL
Hgb urine dipstick: NEGATIVE
KETONES UR: 40 mg/dL — AB
LEUKOCYTES UA: NEGATIVE
Nitrite: NEGATIVE
PROTEIN: 30 mg/dL — AB
Specific Gravity, Urine: 1.025 (ref 1.005–1.030)
UROBILINOGEN UA: 4 mg/dL — AB (ref 0.0–1.0)
pH: 6 (ref 5.0–8.0)

## 2014-02-12 LAB — BASIC METABOLIC PANEL
ANION GAP: 12 (ref 5–15)
BUN: 9 mg/dL (ref 6–23)
CALCIUM: 8.5 mg/dL (ref 8.4–10.5)
CO2: 21 mEq/L (ref 19–32)
CREATININE: 0.51 mg/dL (ref 0.50–1.10)
Chloride: 103 mEq/L (ref 96–112)
Glucose, Bld: 110 mg/dL — ABNORMAL HIGH (ref 70–99)
Potassium: 3 mEq/L — ABNORMAL LOW (ref 3.7–5.3)
Sodium: 136 mEq/L — ABNORMAL LOW (ref 137–147)

## 2014-02-12 LAB — URINE MICROSCOPIC-ADD ON

## 2014-02-12 LAB — TSH: TSH: 0.007 u[IU]/mL — AB (ref 0.350–4.500)

## 2014-02-12 LAB — LIPASE, BLOOD: Lipase: 629 U/L — ABNORMAL HIGH (ref 11–59)

## 2014-02-12 LAB — TRIGLYCERIDES: Triglycerides: 72 mg/dL (ref <150)

## 2014-02-12 LAB — AMYLASE: Amylase: 253 U/L — ABNORMAL HIGH (ref 0–105)

## 2014-02-12 LAB — KETONES, QUALITATIVE: Acetone, Bld: NEGATIVE

## 2014-02-12 MED ORDER — DOXYLAMINE-PYRIDOXINE 10-10 MG PO TBEC
2.0000 | DELAYED_RELEASE_TABLET | Freq: Every day | ORAL | Status: DC
  Administered 2014-02-13: 2 via ORAL

## 2014-02-12 MED ORDER — FLEET ENEMA 7-19 GM/118ML RE ENEM: 1.0000 | ENEMA | Freq: Every day | RECTAL | Status: DC | PRN

## 2014-02-12 MED ORDER — BISACODYL 10 MG RE SUPP
10.0000 mg | Freq: Every day | RECTAL | Status: DC | PRN
  Administered 2014-02-12: 10 mg via RECTAL
  Filled 2014-02-12: qty 1

## 2014-02-12 MED ORDER — KCL-LACTATED RINGERS 20 MEQ/L IV SOLN
INTRAVENOUS | Status: DC
  Filled 2014-02-12 (×3): qty 1000

## 2014-02-12 MED ORDER — POLYETHYLENE GLYCOL 3350 17 G PO PACK: 68.0000 g | PACK | Freq: Once | ORAL | Status: DC

## 2014-02-12 MED ORDER — BOOST / RESOURCE BREEZE PO LIQD
237.0000 mL | Freq: Three times a day (TID) | ORAL | Status: DC
  Administered 2014-02-13 (×2): 1 via ORAL
  Filled 2014-02-12 (×10): qty 1

## 2014-02-12 MED ORDER — POTASSIUM CHLORIDE 10 MEQ/100ML IV SOLN
10.0000 meq | INTRAVENOUS | Status: AC
  Administered 2014-02-12: 10 meq via INTRAVENOUS
  Filled 2014-02-12 (×3): qty 100

## 2014-02-12 MED ORDER — POTASSIUM CHLORIDE 20 MEQ/15ML (10%) PO SOLN
20.0000 meq | Freq: Three times a day (TID) | ORAL | Status: DC
  Administered 2014-02-12 – 2014-02-14 (×3): 20 meq via ORAL
  Filled 2014-02-12 (×10): qty 15

## 2014-02-12 MED ORDER — BISACODYL 10 MG RE SUPP: 10.0000 mg | Freq: Every day | RECTAL | Status: DC | PRN

## 2014-02-12 MED ORDER — KCL-LACTATED RINGERS-D5W 20 MEQ/L IV SOLN
INTRAVENOUS | Status: DC
  Administered 2014-02-12: 01:00:00 via INTRAVENOUS
  Filled 2014-02-12 (×5): qty 1000

## 2014-02-12 NOTE — Progress Notes (Addendum)
INITIAL NUTRITION ASSESSMENT  DOCUMENTATION CODES Per approved criteria  -Non-severe (moderate) malnutrition in the context of acute illness or injury   INTERVENTION: C/L diet, Resource Boost Breeze TID Adv as tol to regular diet w/ snacks ( sm freq meals)  NUTRITION DIAGNOSIS: Inadequate oral intake related to hyperemesis as evidenced by weight loss, v/v.   Goal: tol of po diet, weight gain  Monitor:  weight  Reason for Assessment: Adm. malnutrition screen  23 y.o. female  Admitting Dx: Hyperemesis gravidarum  ASSESSMENT: Per MD report, N/V x 1 month, admitted for n/v that was unable to be managed with outpt medications. Meets ASPEN criteria for Mod degree of malnutrition due to po intake that is < 50 % of estimated needs for > 5 days and weight loss > 5% over 1 month  Height: Ht Readings from Last 1 Encounters:  02/11/14 5\' 1"  (1.549 m)    Weight: Wt Readings from Last 1 Encounters:  02/12/14 107 lb (48.535 kg)    Ideal Body Weight: 105Lbs  % Ideal Body Weight: 102%  Wt Readings from Last 10 Encounters:  02/12/14 107 lb (48.535 kg)  02/04/14 112 lb (50.803 kg)  05/12/13 120 lb (54.432 kg)    Usual Body Weight: 120 lbs  % Usual Body Weight: 89%  BMI:  Body mass index is 20.23 kg/(m^2).  Estimated Nutritional Needs: Kcal: 1300-1500 Protein: 50-60 g Fluid: 1.5  Diet Order: Diet clear liquid  EDUCATION NEEDS: -No education needs identified at this time   Intake/Output Summary (Last 24 hours) at 02/12/14 0753 Last data filed at 02/12/14 0515  Gross per 24 hour  Intake 2517.41 ml  Output    475 ml  Net 2042.41 ml    Labs:   Recent Labs Lab 02/11/14 1544 02/12/14 0628  NA 133* 136*  K 3.1* 3.0*  CL 95* 103  CO2 16* 21  BUN 14 9  CREATININE 0.50 0.51  CALCIUM 9.6 8.5  GLUCOSE 82 110*    CBG (last 3)  No results for input(s): GLUCAP in the last 72 hours.  Scheduled Meds: . famotidine (PEPCID) IV  20 mg Intravenous Q12H  . feeding  supplement (RESOURCE BREEZE)  237 mL Oral TID WC  . ondansetron (ZOFRAN) IV  8 mg Intravenous 3 times per day  . prenatal multivitamin  1 tablet Oral Q1200  . pyridOXINE  100 mg Intravenous Daily    Continuous Infusions: . dextrose 5% lactated ringers 125 mL/hr at 02/11/14 1736  . dextrose 5% lactated ringers with KCl 20 mEq/L 125 mL/hr at 02/12/14 [id number]    Past Medical History  Diagnosis Date  . Infection     UTI  . Eczema   . Anxiety     no meds    Past Surgical History  Procedure Laterality Date  . No past surgeries      Elisabeth CaraKatherine Blue Winther M.Odis LusterEd. R.D. LDN Neonatal Nutrition Support Specialist/RD III Pager [phone]

## 2014-02-12 NOTE — Consult Note (Signed)
Consultation  Referring Provider:  Dr. Dion Body    Primary Care Physician:  No PCP Per Patient Primary Gastroenterologist: none      Reason for Consultation: abdominal pain, pancreatitis             HPI:   Chelsea Small is a 23 y.o. female, [redacted] weeks gestation admitted with nausea/vomiting/upper abdominal pain. Nausea /vomiting started mid November. Patient saw PCP, urine preg was negative. She developed intermittent upper abdominal pain last week, was evaluated in ED where lipase mildly at 138. Ultrasound was normal. For persistent symptoms patient went to ED 12/16 where amylase and lipase found to be elevated and a urine preg test positive.. Patient  admitted by GYN for further evaluation and treatment. She has recently lost 10 pounds. Amylase and lipase have continued to rise at 253 and 629 respectively.  Patient has no previous history of pancreatitis. Not on any medications / herbs. She hasn't consumed ETOH since August and not a heavy drinker. No FMH of pancreatic diseases. She has no chronic GI complaints.   Past Medical History  Diagnosis Date  . Infection     UTI  . Eczema   . Anxiety     no meds    Past Surgical History  Procedure Laterality Date  . No past surgeries      Family History  Problem Relation Age of Onset  . Hypertension Father   . Stroke Maternal Grandfather   . Cancer Paternal Grandmother     lung  . Hearing loss Paternal Grandfather     with age    History  Substance Use Topics  . Smoking status: Never Smoker   . Smokeless tobacco: Never Used  . Alcohol Use: No    Prior to Admission medications   Medication Sig Start Date End Date Taking? Authorizing Provider  metoCLOPramide (REGLAN) 10 MG tablet Take 1 tablet (10 mg total) by mouth every 6 (six) hours as needed for nausea or vomiting (nausea/headache). 02/04/14  Yes Loren Racer, MD  ondansetron (ZOFRAN-ODT) 4 MG disintegrating tablet Take 4 mg by mouth every 8 (eight) hours as needed for  nausea.  02/01/14  Yes Historical Provider, MD    Current Facility-Administered Medications  Medication Dose Route Frequency Provider Last Rate Last Dose  . dextrose 5 % in lactated ringers infusion   Intravenous Continuous Geryl Rankins, MD 150 mL/hr at 02/12/14 0926    . Doxylamine-Pyridoxine 10-10 MG TBEC 2 tablet  2 tablet Oral QHS Geryl Rankins, MD      . famotidine (PEPCID) IVPB 20 mg  20 mg Intravenous Q12H Geryl Rankins, MD   20 mg at 02/12/14 1137  . feeding supplement (RESOURCE BREEZE) (RESOURCE BREEZE) liquid 1 Container  237 mL Oral TID WC Inez Pilgrim, RD   1 Container at 02/12/14 0800  . ondansetron (ZOFRAN) 8 mg/NS 50 ml IVPB  8 mg Intravenous 3 times per day Geryl Rankins, MD   8 mg at 02/12/14 1511  . potassium chloride 20 MEQ/15ML (10%) solution 20 mEq  20 mEq Oral 3 times per day Geryl Rankins, MD   20 mEq at 02/12/14 1405  . prenatal multivitamin tablet 1 tablet  1 tablet Oral Q1200 Geryl Rankins, MD   1 tablet at 02/12/14 1136  . promethazine (PHENERGAN) tablet 12.5 mg  12.5 mg Oral Q4H PRN Geryl Rankins, MD       Or  . promethazine (PHENERGAN) injection 12.5 mg  12.5 mg Intravenous Q4H PRN Geryl Rankins,  MD       Or  . promethazine (PHENERGAN) suppository 25 mg  25 mg Rectal Q6H PRN Geryl RankinsEvelyn Varnado, MD      . pyridOXINE (B-6) injection 100 mg  100 mg Intravenous Daily Geryl RankinsEvelyn Varnado, MD   100 mg at 02/12/14 0925    Allergies as of 02/11/2014  . (No Known Allergies)    Review of Systems:      Physical Exam:  Vital signs in last 24 hours: Temp:  [98.4 F (36.9 C)-99.6 F (37.6 C)] 98.5 F (36.9 C) (12/17 1200) Pulse Rate:  [97-109] 100 (12/17 1200) Resp:  [18] 18 (12/17 1200) BP: (97-109)/(50-61) 97/50 mmHg (12/17 1200) SpO2:  [100 %] 100 % (12/17 1200) Weight:  [107 lb (48.535 kg)-108 lb (48.988 kg)] 108 lb (48.988 kg) (12/17 0926) Last BM Date:  (pt states she doesnt remember) General:   Pleasant black female in NAD. Parents / friend in  room Head:  Normocephalic and atraumatic. Eyes:   No icterus.   Conjunctiva pink. Ears:  Normal auditory acuity. Neck:  Supple; no masses felt Lungs:  Respirations even and unlabored. Lungs clear to auscultation bilaterally.     Heart:  Regular rate and rhythm. Abdomen:  Soft, nondistended, mild left mid abdominal tenderness.. Normal bowel sounds. No appreciable masses or hepatomegaly.  Rectal:  Not performed.  Msk:  Symmetrical without gross deformities.  Extremities:  Without edema. Neurologic:  Alert and  oriented x4;  grossly normal neurologically. Skin:  Intact without significant lesions or rashes. Cervical Nodes:  No significant cervical adenopathy. Psych:  Alert and cooperative. Normal affect.  LAB RESULTS:  Recent Labs  02/11/14 1544  WBC 9.9  HGB 11.6*  HCT 32.9*  PLT 263   BMET  Recent Labs  02/11/14 1544 02/12/14 0628  NA 133* 136*  K 3.1* 3.0*  CL 95* 103  CO2 16* 21  GLUCOSE 82 110*  BUN 14 9  CREATININE 0.50 0.51  CALCIUM 9.6 8.5   LFT  Recent Labs  02/11/14 1544  PROT 8.0  ALBUMIN 4.0  AST 19  ALT 21  ALKPHOS 48  BILITOT 0.5    STUDIES: [place] Ob Comp Less 14 Wks  02/11/2014   CLINICAL DATA:  Pregnant, hyperemesis  EXAM: OBSTETRIC <14 WK ULTRASOUND  TECHNIQUE: Transabdominal ultrasound was performed for evaluation of the gestation as well as the maternal uterus and adnexal regions.  COMPARISON:  None.  FINDINGS: Intrauterine gestational sac: Visualized/normal in shape.  Yolk sac:  Present  Embryo:  Present  Cardiac Activity: Present  Heart Rate: 173 bpm  CRL:   39.7  mm   10 w 6 d                  US EDC: 09/03/2014  Maternal uterus/adnexae: Small subchorionic hemorrhage.  Bilateral ovaries are within normal limits.  No free fluid.  IMPRESSION: Single live intrauterine gestation with estimated gestational age [redacted] weeks 6 days by crown-rump length.   Electronically Signed   By: Charline BillsSriyesh  Krishnan M.D.   On: 02/11/2014 19:36   PREVIOUS ENDOSCOPIES:             none   Impression / Plan:   691. 23 year old female, [redacted] weeks gestation with several week history of nausea / vomiting and a one week history of upper abdominal pain. Nausea / vomiting may be related to pregnancy. She doesn't have any of the typical risk factors for pancreatitis if, this is in fact pancreatitis. Nausea and  vomiting can cause elevated amylase but not sure about lipase. Her LFTs are normal. Ultrasound normal. Obviously would try to avoid CTscan given early pregnancy. Given motrin use agree with Pepcid. Continue anti-emetics. Trial of clears. Given early gestation we may need to take a watch and wait approach. Will recheck am amylase / lipase and add triglyceride level.   2. ? Constipation. LLQ feels full and tender on exam. Trial of Dulcolax suppository.    Thanks   LOS: 1 day   Willette Clusteraula Guenther  02/12/2014, 3:56 PM    ________________________________________________________________________  [facility] MD note:  I personally examined the patient, reviewed the data and agree with the assessment and plan described above.  She really has no abdominal tenderness, pains currently. That is very unusual for acute pancreatitis. No clear etiology of pancreatitis either.  Will check triglycerides.  Perhaps her pancreatic enzyme elevation are vomiting, nausea related and she does not have actual pancreatic inflammation. Imaging (CT) would be helpful but obvious not a good idea currently.  MRI can help prove pancreatic inflammation as well, but I don't think we need that given lack of pain.  Instead, will treat as if she does truly have mild acute pancreatitis, idiopathic. That is; IVF, advance diet as tolerated starting now since she is hungry, no vomiting in days, has no abd pains or tenderness.  She is definitely constipated, tells me no BM in several weeks. Will given several doses of miralax tonight and suppository. Will repeat if needed. Will follow along.   Rob Buntinganiel Kaleiyah Polsky,  MD [facility] Pager [phone]

## 2014-02-12 NOTE — Progress Notes (Addendum)
HD #2 IUP at 11 0/7 wks, Hyperemesis  Pt was seen this am and now at lunch.  Subjective: Pt feeling better.  No nausea or vomiting overnight but felt naseous when she got up to go to the bathroom this am. Tolerated ice chips.  Pt was scared to eat this morning but would like to eat. Denies abdominal pain.  Pt has not had a BM since "last month."  Starting to feel like she it will come.  Declines Dulcolax suppository.  Objective: Temp:  [98.4 F (36.9 C)-99.6 F (37.6 C)] 98.5 F (36.9 C) (12/17 1200) Pulse Rate:  [97-109] 100 (12/17 1200) Resp:  [18] 18 (12/17 1200) BP: (97-121)/(50-73) 97/50 mmHg (12/17 1200) SpO2:  [100 %] 100 % (12/17 1200) Weight:  [44.566 kg (98 lb 4 oz)-48.988 kg (108 lb)] 48.988 kg (108 lb) (12/17 0926)  Physical Exam: Gen: NAD, appears more alert.  Lips dry. Abd:  No epigastric tenderness., Nondistended, soft. DVT Evaluation: No  Edema present, no calf tenderness bilaterally    Recent Labs  02/11/14 1544  HGB 11.6*  HCT 32.9*    K+= 3.1 to 3.0 Amylase 253, Lipase 629 Urine 40 ketones  Ultrasound 12/16-Viable, single IUP at 10 6/7 weeks, ovaries normal.    Assessment/Plan:  IUP at 11 0/7 weeks-Normal single IUP, no molar pregnancy Hyperemesis gravaderim.-  On Zofran scheduled,Pepcid and Vitamin B6. No vomiting in almost 24 hours. Hypokalemia. Elevated Amylase and lipase.  Likely due to vomiting but r/o pancreatitis.  Epigastric pain as resolved. Low TSH.  Likely due to Hyperemesis.  Add on Diclegis, 2 tabs at bedtime. Continue IVF at 150/hr until ketones negative.  Strict I/Os. Pt can not tolerate potassium infusion and she does not swallow pills.  KCL solution TID. Consulted GI to r/o pancreatitis. Check Free T4. NPO until GI evaluation.  If Ok with GI, advance to clears. Discussed plan at length with pt and family.   Will check out to Dr. Estanislado Pandyivard who will cover from 7p to 7a. Dr. Charlotta Newtonzan to cover tomorrow from 7a-7p.     Geryl RankinsVARNADO,  Carla Rashad 02/12/2014, 3:21 PM

## 2014-02-13 LAB — URINE CULTURE
Colony Count: NO GROWTH
Culture: NO GROWTH

## 2014-02-13 LAB — URINALYSIS, DIPSTICK ONLY
Bilirubin Urine: NEGATIVE
GLUCOSE, UA: 100 mg/dL — AB
Hgb urine dipstick: NEGATIVE
KETONES UR: NEGATIVE mg/dL
Leukocytes, UA: NEGATIVE
Nitrite: NEGATIVE
PH: 6 (ref 5.0–8.0)
Protein, ur: NEGATIVE mg/dL
Specific Gravity, Urine: 1.025 (ref 1.005–1.030)
Urobilinogen, UA: 4 mg/dL — ABNORMAL HIGH (ref 0.0–1.0)

## 2014-02-13 LAB — BASIC METABOLIC PANEL
Anion gap: 9 (ref 5–15)
BUN: 5 mg/dL — AB (ref 6–23)
CHLORIDE: 103 meq/L (ref 96–112)
CO2: 24 meq/L (ref 19–32)
CREATININE: 0.47 mg/dL — AB (ref 0.50–1.10)
Calcium: 8.3 mg/dL — ABNORMAL LOW (ref 8.4–10.5)
GFR calc Af Amer: >90 mL/min (ref >90)
GFR calc non Af Amer: >90 mL/min (ref >90)
Glucose, Bld: 89 mg/dL (ref 70–99)
Potassium: 3.3 mEq/L — ABNORMAL LOW (ref 3.7–5.3)
Sodium: 136 mEq/L — ABNORMAL LOW (ref 137–147)

## 2014-02-13 LAB — AMYLASE: Amylase: 278 U/L — ABNORMAL HIGH (ref 0–105)

## 2014-02-13 LAB — T4, FREE: Free T4: 2.13 ng/dL — ABNORMAL HIGH (ref 0.80–1.80)

## 2014-02-13 LAB — LIPASE, BLOOD: Lipase: 671 U/L — ABNORMAL HIGH (ref 11–59)

## 2014-02-13 MED ORDER — FAMOTIDINE 20 MG PO TABS
20.0000 mg | ORAL_TABLET | Freq: Two times a day (BID) | ORAL | Status: DC
  Administered 2014-02-13 – 2014-02-14 (×2): 20 mg via ORAL
  Filled 2014-02-13 (×2): qty 1

## 2014-02-13 MED ORDER — PYRIDOXINE HCL 25 MG PO TABS
25.0000 mg | ORAL_TABLET | Freq: Every day | ORAL | Status: DC
  Administered 2014-02-14: 25 mg via ORAL
  Filled 2014-02-13 (×2): qty 1

## 2014-02-13 MED ORDER — ONDANSETRON 8 MG PO TBDP
8.0000 mg | ORAL_TABLET | Freq: Three times a day (TID) | ORAL | Status: DC
  Administered 2014-02-13 – 2014-02-14 (×3): 8 mg via ORAL
  Filled 2014-02-13 (×6): qty 1

## 2014-02-13 MED ORDER — KCL-LACTATED RINGERS-D5W 20 MEQ/L IV SOLN
INTRAVENOUS | Status: DC
  Administered 2014-02-13 – 2014-02-14 (×4): via INTRAVENOUS
  Filled 2014-02-13 (×8): qty 1000

## 2014-02-13 NOTE — Progress Notes (Signed)
    Progress Note   Subjective  vomited once this am after smelling hand sanitizer but held down 1/2 biscuit.    Objective   Vital signs in last 24 hours: Temp:  [98.7 F (37.1 C)-99.2 F (37.3 C)] 98.8 F (37.1 C) (12/18 1200) Pulse Rate:  [89-106] 89 (12/18 1200) Resp:  [12-18] 16 (12/18 1200) BP: (97-103)/(57-64) 98/57 mmHg (12/18 1200) SpO2:  [100 %] 100 % (12/18 1200) Weight:  [110 lb (49.896 kg)] 110 lb (49.896 kg) (12/18 0435) Last BM Date: 02/12/14 General:    black female in NAD Heart:  Regular rate and rhythm Abdomen:  Soft, nontender and nondistended. Normal bowel sounds. Extremities:  Without edema. Neurologic:  Alert and oriented,  grossly normal neurologically. Psych:  Cooperative. Normal mood and affect.  Lab Results:  Recent Labs  02/11/14 1544  WBC 9.9  HGB 11.6*  HCT 32.9*  PLT 263   BMET  Recent Labs  02/11/14 1544 02/12/14 0628  NA 133* 136*  K 3.1* 3.0*  CL 95* 103  CO2 16* 21  GLUCOSE 82 110*  BUN 14 9  CREATININE 0.50 0.51  CALCIUM 9.6 8.5   LFT  Recent Labs  02/11/14 1544  PROT 8.0  ALBUMIN 4.0  AST 19  ALT 21  ALKPHOS 48  BILITOT 0.5      Assessment / Plan:    371. 23 year old female, [redacted] weeks gestation with several week history of nausea / vomiting and a one week history of upper abdominal pain. Nausea / vomiting may be related to pregnancy. She doesn't have any of the typical risk factors for pancreatitis but can't confirm without imaging.Triglycerides 27.  Amylase/lipase still up but no significant pain and tolerated 1/2 biscuit today. ial of clears. Given early gestation we are taking a watch and wait approach. If continues to improve she should be stable for discharge tomorrow. Will put office follow up appointment in computer.   2. Constipation, good results with suppository.   LOS: 2 days   Willette Clusteraula Guenther  02/13/2014, 12:31 PM   ________________________________________________________________________  [facility]  [facility] MD note:  I personally examined the patient, reviewed the data and agree with the assessment and plan described above.  Seems to still be pretty nauseas, not very hungry at all now.  NO abdominal pains however. Good BM yesterday, twice.  Non bloody.  In addition to her hyperemesis she MAY also have mild acute pancreatitis however it is pretty unusual to have even mild pancreatitis without abdominal pains.  Recommend treating her as hyperemesis, which is similar to acute pancreatitis treatment anyway: IVfluids, nausea control, electrolyte repletion and advance her diet as tolerated (doesn't seem to be advancing very quickly currently).  Will continue to follow along.   Rob Buntinganiel Kiernan Farkas, MD [facility] Pager [phone]

## 2014-02-13 NOTE — Progress Notes (Signed)
HD #3 IUP at 11 1/7 wks, Hyperemesis   Subjective: Pt resting comfortably in bed.  She reports slight nausea, but has not had any vomiting since Tuesday night.  Tolerating CLD only- mostly ice chips and italian ice.   Feeling hungry this am.  Denies abdominal pain.  Pt had BM yesterday.      Objective: Temp:  [98.5 F (36.9 C)-99.2 F (37.3 C)] 98.7 F (37.1 C) (12/18 0504) Pulse Rate:  [91-106] 93 (12/18 0504) Resp:  [12-18] 12 (12/18 0504) BP: (97-103)/(50-64) 97/62 mmHg (12/18 0504) SpO2:  [100 %] 100 % (12/18 0504) Weight:  [48.988 kg (108 lb)-49.896 kg (110 lb)] 49.896 kg (110 lb) (12/18 0435)  Physical Exam: Gen: NAD, alert & oriented CV: RRR Lung: CTAB Abd:  No epigastric tenderness., Nondistended, soft. DVT Evaluation: No  Edema present, no calf tenderness bilaterally  Results for orders placed or performed during the hospital encounter of 02/11/14 (from the past 24 hour(s))  Urinalysis, dipstick only     Status: Abnormal   Collection Time: 02/12/14 11:40 AM  Result Value Ref Range   Specific Gravity, Urine >1.030 (H) 1.005 - 1.030   pH 6.0 5.0 - 8.0   Glucose, UA NEGATIVE NEGATIVE mg/dL   Hgb urine dipstick NEGATIVE NEGATIVE   Bilirubin Urine NEGATIVE NEGATIVE   Ketones, ur 40 (A) NEGATIVE mg/dL   Protein, ur NEGATIVE NEGATIVE mg/dL   Urobilinogen, UA 4.0 (H) 0.0 - 1.0 mg/dL   Nitrite NEGATIVE NEGATIVE   Leukocytes, UA NEGATIVE NEGATIVE  Urinalysis, dipstick only     Status: Abnormal   Collection Time: 02/12/14  2:37 PM  Result Value Ref Range   Specific Gravity, Urine >1.030 (H) 1.005 - 1.030   pH 6.0 5.0 - 8.0   Glucose, UA 250 (A) NEGATIVE mg/dL   Hgb urine dipstick TRACE (A) NEGATIVE   Bilirubin Urine SMALL (A) NEGATIVE   Ketones, ur 40 (A) NEGATIVE mg/dL   Protein, ur NEGATIVE NEGATIVE mg/dL   Urobilinogen, UA 4.0 (H) 0.0 - 1.0 mg/dL   Nitrite NEGATIVE NEGATIVE   Leukocytes, UA NEGATIVE NEGATIVE  Triglycerides     Status: None   Collection Time:  02/12/14  5:30 PM  Result Value Ref Range   Triglycerides 72 <150 mg/dL  Urinalysis, dipstick only     Status: Abnormal   Collection Time: 02/12/14  6:42 PM  Result Value Ref Range   Specific Gravity, Urine >1.030 (H) 1.005 - 1.030   pH 6.0 5.0 - 8.0   Glucose, UA 100 (A) NEGATIVE mg/dL   Hgb urine dipstick TRACE (A) NEGATIVE   Bilirubin Urine NEGATIVE NEGATIVE   Ketones, ur NEGATIVE NEGATIVE mg/dL   Protein, ur NEGATIVE NEGATIVE mg/dL   Urobilinogen, UA 4.0 (H) 0.0 - 1.0 mg/dL   Nitrite NEGATIVE NEGATIVE   Leukocytes, UA TRACE (A) NEGATIVE  Urinalysis, dipstick only     Status: Abnormal   Collection Time: 02/13/14  4:35 AM  Result Value Ref Range   Specific Gravity, Urine 1.025 1.005 - 1.030   pH 6.0 5.0 - 8.0   Glucose, UA 100 (A) NEGATIVE mg/dL   Hgb urine dipstick NEGATIVE NEGATIVE   Bilirubin Urine NEGATIVE NEGATIVE   Ketones, ur NEGATIVE NEGATIVE mg/dL   Protein, ur NEGATIVE NEGATIVE mg/dL   Urobilinogen, UA 4.0 (H) 0.0 - 1.0 mg/dL   Nitrite NEGATIVE NEGATIVE   Leukocytes, UA NEGATIVE NEGATIVE  Amylase     Status: Abnormal   Collection Time: 02/13/14  5:43 AM  Result Value Ref  Range   Amylase 278 (H) 0 - 105 U/L  Lipase, blood     Status: Abnormal   Collection Time: 02/13/14  5:43 AM  Result Value Ref Range   Lipase 671 (H) 11 - 59 U/L    Ultrasound 12/16-Viable, single IUP at 10 6/7 weeks, ovaries normal.    Assessment/Plan: 23yo G1P0 @ 1467w1d admitted for hyperemesis:  FWB- IUP at 11 1/7 weeks-Normal single IUP, no molar pregnancy  1) Hyperemesis gravaderim:  On Zofran scheduled,Pepcid and Vitamin B6. No vomiting in almost 24 hours.  Add on Diclegis, 2 tabs at bedtime- pt did not need overnight  Plan to advance to general diet today- once tolerating diet, will transition to po anti-emetics.  2) FEN:  Change to O1HY+QMV@5LR+KCL@ 125cc/hr  Advance to general diet Hypokalemia- refused potassium orally, will add back to IV  3) Low TSH, likely due to hCG, will check  Free T4.  4) Elevated amylase and lipase  Continue to trend up despite improved nausea/vomiting.    GI following and appreciate any suggestions.  For now concern for possible mild acute pancreatitis, idopathic  DISPO: Advance diet today, if tolerated general diet, will transition to po antiemetics.  Myna HidalgoZAN, Penny Arrambide, M 02/13/2014, 7:54 AM

## 2014-02-14 LAB — CBC
HCT: 26.5 % — ABNORMAL LOW (ref 36.0–46.0)
HEMOGLOBIN: 9.2 g/dL — AB (ref 12.0–15.0)
MCH: 31.1 pg (ref 26.0–34.0)
MCHC: 34.7 g/dL (ref 30.0–36.0)
MCV: 89.5 fL (ref 78.0–100.0)
PLATELETS: 163 10*3/uL (ref 150–400)
RBC: 2.96 MIL/uL — ABNORMAL LOW (ref 3.87–5.11)
RDW: 12.6 % (ref 11.5–15.5)
WBC: 6 10*3/uL (ref 4.0–10.5)

## 2014-02-14 LAB — COMPREHENSIVE METABOLIC PANEL
ALT: 24 U/L (ref 0–35)
ANION GAP: 11 (ref 5–15)
AST: 23 U/L (ref 0–37)
Albumin: 2.7 g/dL — ABNORMAL LOW (ref 3.5–5.2)
Alkaline Phosphatase: 38 U/L — ABNORMAL LOW (ref 39–117)
BILIRUBIN TOTAL: 0.3 mg/dL (ref 0.3–1.2)
BUN: 3 mg/dL — AB (ref 6–23)
CHLORIDE: 98 meq/L (ref 96–112)
CO2: 24 meq/L (ref 19–32)
CREATININE: 0.41 mg/dL — AB (ref 0.50–1.10)
Calcium: 8.5 mg/dL (ref 8.4–10.5)
GFR calc Af Amer: >90 mL/min (ref >90)
Glucose, Bld: 76 mg/dL (ref 70–99)
Potassium: 3.3 mEq/L — ABNORMAL LOW (ref 3.7–5.3)
Sodium: 133 mEq/L — ABNORMAL LOW (ref 137–147)
Total Protein: 6.1 g/dL (ref 6.0–8.3)

## 2014-02-14 LAB — LIPASE, BLOOD: Lipase: 627 U/L — ABNORMAL HIGH (ref 11–59)

## 2014-02-14 LAB — AMYLASE: AMYLASE: 251 U/L — AB (ref 0–105)

## 2014-02-14 MED ORDER — DOXYLAMINE-PYRIDOXINE 10-10 MG PO TBEC: 2.0000 | DELAYED_RELEASE_TABLET | Freq: Every day | ORAL | Status: DC

## 2014-02-14 MED ORDER — PYRIDOXINE HCL 25 MG PO TABS
25.0000 mg | ORAL_TABLET | Freq: Every day | ORAL | Status: DC
Start: 2014-02-14 — End: 2014-09-01

## 2014-02-14 MED ORDER — COMPLETENATE 29-1 MG PO CHEW
1.0000 | CHEWABLE_TABLET | Freq: Every day | ORAL | Status: DC
  Administered 2014-02-14: 1 via ORAL
  Filled 2014-02-14 (×2): qty 1

## 2014-02-14 MED ORDER — DOCUSATE SODIUM 100 MG PO CAPS: 100.0000 mg | ORAL_CAPSULE | Freq: Two times a day (BID) | ORAL | Status: DC | PRN

## 2014-02-14 MED ORDER — FAMOTIDINE 20 MG PO TABS: 20.0000 mg | ORAL_TABLET | Freq: Two times a day (BID) | ORAL | Status: DC

## 2014-02-14 MED ORDER — ONDANSETRON 8 MG PO TBDP: 8.0000 mg | ORAL_TABLET | Freq: Three times a day (TID) | ORAL | Status: DC

## 2014-02-14 NOTE — Progress Notes (Deleted)
Discharge instructions provided to patient and significant other at bedside.  Activity, medications, follow up appointments, when to call the doctor and community resources discussed.  No questions at this time.  Patient left unit in stable condition with all personal belongings accompanied by staff.  K. Addie Alonge, RN------------------------  

## 2014-02-14 NOTE — Progress Notes (Signed)
Hospital day # 3 pregnancy at 11.2wks--Hyperemesis.  S:  Patient reports feeling better and tolerating PO Medications without issues.  States she is about to eat lunch.  Reports some anxiety about going home and dehydration.  States "I don't have this IV and worry that I won't get enough to drink."  Reassurances given.     O: BP 107/63 mmHg  Pulse 93  Temp(Src) 97.7 F (36.5 C) (Oral)  Resp 18  Ht 5\' 1"  (1.549 m)  Wt 114 lb 1.3 oz (51.746 kg)  BMI 21.57 kg/m2  SpO2 100%  LMP 11/30/2013     Physical Exam General appearance: alert, well appearing, and in no distress. Chest: clear to auscultation, no wheezes, rales or rhonchi, symmetric air entry.  CVS exam: normal rate and regular rhythm. Skin exam - normal coloration and turgor, no rashes, no suspicious skin lesions noted. Exam of extremities: peripheral pulses normal, no pedal edema, no clubbing or cyanosis Abdominal exam: soft, nontender, nondistended, no masses or organomegaly.       Labs:   Results for orders placed or performed during the hospital encounter of 02/11/14 (from the past 24 hour(s))  Basic metabolic panel     Status: Abnormal   Collection Time: 02/13/14  6:41 PM  Result Value Ref Range   Sodium 136 (L) 137 - 147 mEq/L   Potassium 3.3 (L) 3.7 - 5.3 mEq/L   Chloride 103 96 - 112 mEq/L   CO2 24 19 - 32 mEq/L   Glucose, Bld 89 70 - 99 mg/dL   BUN 5 (L) 6 - 23 mg/dL   Creatinine, Ser 3.080.47 (L) 0.50 - 1.10 mg/dL   Calcium 8.3 (L) 8.4 - 10.5 mg/dL   GFR calc non Af Amer >90 >90 mL/min   GFR calc Af Amer >90 >90 mL/min   Anion gap 9 5 - 15  Comprehensive metabolic panel     Status: Abnormal   Collection Time: 02/14/14 11:43 AM  Result Value Ref Range   Sodium 133 (L) 137 - 147 mEq/L   Potassium 3.3 (L) 3.7 - 5.3 mEq/L   Chloride 98 96 - 112 mEq/L   CO2 24 19 - 32 mEq/L   Glucose, Bld 76 70 - 99 mg/dL   BUN 3 (L) 6 - 23 mg/dL   Creatinine, Ser 6.570.41 (L) 0.50 - 1.10 mg/dL   Calcium 8.5 8.4 - 84.610.5 mg/dL   Total  Protein 6.1 6.0 - 8.3 g/dL   Albumin 2.7 (L) 3.5 - 5.2 g/dL   AST 23 0 - 37 U/L   ALT 24 0 - 35 U/L   Alkaline Phosphatase 38 (L) 39 - 117 U/L   Total Bilirubin 0.3 0.3 - 1.2 mg/dL   GFR calc non Af Amer >90 >90 mL/min   GFR calc Af Amer >90 >90 mL/min   Anion gap 11 5 - 15  CBC     Status: Abnormal   Collection Time: 02/14/14 11:43 AM  Result Value Ref Range   WBC 6.0 4.0 - 10.5 K/uL   RBC 2.96 (L) 3.87 - 5.11 MIL/uL   Hemoglobin 9.2 (L) 12.0 - 15.0 g/dL   HCT 96.226.5 (L) 95.236.0 - 84.146.0 %   MCV 89.5 78.0 - 100.0 fL   MCH 31.1 26.0 - 34.0 pg   MCHC 34.7 30.0 - 36.0 g/dL   RDW 32.412.6 40.111.5 - 02.715.5 %   Platelets 163 150 - 400 K/uL          Meds:  Scheduled Meds: .  Doxylamine-Pyridoxine  2 tablet Oral QHS  . famotidine  20 mg Oral BID  . feeding supplement (RESOURCE BREEZE)  237 mL Oral TID WC  . ondansetron  8 mg Oral 3 times per day  . polyethylene glycol  68 g Oral Once  . potassium chloride  20 mEq Oral 3 times per day  . prenatal vitamin w/FE, FA  1 tablet Oral Q1200  . vitamin B-6  25 mg Oral Daily   Continuous Infusions: . dextrose 5% lactated ringers with KCl 20 mEq/L 125 mL/hr at 02/14/14 1148   PRN Meds:.bisacodyl, promethazine **OR** promethazine **OR** promethazine, sodium phosphate   A: IUP at 11.2wks Hyperemesis Elevated Amylase/Lipase Stable Tolerating PO Medications  P: Consume bland lunch and will reassess in 2 hours for toleration Await for repeat labs If labs stable and po tolerated, discharge to home with follow up as scheduled Continue current plan of care  Upcoming tests/treatments:  PO Challenge, Labs: CMP, Amylase, Lipase, CBC  Dr. Lance MorinA. Roberts updated  [facility]EMLY, Zorion Nims LYNN CNM, MSN 02/14/2014 12:34 PM

## 2014-02-14 NOTE — Discharge Instructions (Signed)
First Trimester of Pregnancy °The first trimester of pregnancy is from week 1 until the end of week 12 (months 1 through 3). A week after a sperm fertilizes an egg, the egg will implant on the wall of the uterus. This embryo will begin to develop into a baby. Genes from you and your partner are forming the baby. The female genes determine whether the baby is a boy or a girl. At 6-8 weeks, the eyes and face are formed, and the heartbeat can be seen on ultrasound. At the end of 12 weeks, all the baby's organs are formed.  °Now that you are pregnant, you will want to do everything you can to have a healthy baby. Two of the most important things are to get good prenatal care and to follow your health care provider's instructions. Prenatal care is all the medical care you receive before the baby's birth. This care will help prevent, find, and treat any problems during the pregnancy and childbirth. °BODY CHANGES °Your body goes through many changes during pregnancy. The changes vary from woman to woman.  °· You may gain or lose a couple of pounds at first. °· You may feel sick to your stomach (nauseous) and throw up (vomit). If the vomiting is uncontrollable, call your health care provider. °· You may tire easily. °· You may develop headaches that can be relieved by medicines approved by your health care provider. °· You may urinate more often. Painful urination may mean you have a bladder infection. °· You may develop heartburn as a result of your pregnancy. °· You may develop constipation because certain hormones are causing the muscles that push waste through your intestines to slow down. °· You may develop hemorrhoids or swollen, bulging veins (varicose veins). °· Your breasts may begin to grow larger and become tender. Your nipples may stick out more, and the tissue that surrounds them (areola) may become darker. °· Your gums may bleed and may be sensitive to brushing and flossing. °· Dark spots or blotches (chloasma,  mask of pregnancy) may develop on your face. This will likely fade after the baby is born. °· Your menstrual periods will stop. °· You may have a loss of appetite. °· You may develop cravings for certain kinds of food. °· You may have changes in your emotions from day to day, such as being excited to be pregnant or being concerned that something may go wrong with the pregnancy and baby. °· You may have more vivid and strange dreams. °· You may have changes in your hair. These can include thickening of your hair, rapid growth, and changes in texture. Some women also have hair loss during or after pregnancy, or hair that feels dry or thin. Your hair will most likely return to normal after your baby is born. °WHAT TO EXPECT AT YOUR PRENATAL VISITS °During a routine prenatal visit: °· You will be weighed to make sure you and the baby are growing normally. °· Your blood pressure will be taken. °· Your abdomen will be measured to track your baby's growth. °· The fetal heartbeat will be listened to starting around week 10 or 12 of your pregnancy. °· Test results from any previous visits will be discussed. °Your health care provider may ask you: °· How you are feeling. °· If you are feeling the baby move. °· If you have had any abnormal symptoms, such as leaking fluid, bleeding, severe headaches, or abdominal cramping. °· If you have any questions. °Other tests   that may be performed during your first trimester include: °· Blood tests to find your blood type and to check for the presence of any previous infections. They will also be used to check for low iron levels (anemia) and Rh antibodies. Later in the pregnancy, blood tests for diabetes will be done along with other tests if problems develop. °· Urine tests to check for infections, diabetes, or protein in the urine. °· An ultrasound to confirm the proper growth and development of the baby. °· An amniocentesis to check for possible genetic problems. °· Fetal screens for  spina bifida and Down syndrome. °· You may need other tests to make sure you and the baby are doing well. °HOME CARE INSTRUCTIONS  °Medicines °· Follow your health care provider's instructions regarding medicine use. Specific medicines may be either safe or unsafe to take during pregnancy. °· Take your prenatal vitamins as directed. °· If you develop constipation, try taking a stool softener if your health care provider approves. °Diet °· Eat regular, well-balanced meals. Choose a variety of foods, such as meat or vegetable-based protein, fish, milk and low-fat dairy products, vegetables, fruits, and whole grain breads and cereals. Your health care provider will help you determine the amount of weight gain that is right for you. °· Avoid raw meat and uncooked cheese. These carry germs that can cause birth defects in the baby. °· Eating four or five small meals rather than three large meals a day may help relieve nausea and vomiting. If you start to feel nauseous, eating a few soda crackers can be helpful. Drinking liquids between meals instead of during meals also seems to help nausea and vomiting. °· If you develop constipation, eat more high-fiber foods, such as fresh vegetables or fruit and whole grains. Drink enough fluids to keep your urine clear or pale yellow. °Activity and Exercise °· Exercise only as directed by your health care provider. Exercising will help you: °¨ Control your weight. °¨ Stay in shape. °¨ Be prepared for labor and delivery. °· Experiencing pain or cramping in the lower abdomen or low back is a good sign that you should stop exercising. Check with your health care provider before continuing normal exercises. °· Try to avoid standing for long periods of time. Move your legs often if you must stand in one place for a long time. °· Avoid heavy lifting. °· Wear low-heeled shoes, and practice good posture. °· You may continue to have sex unless your health care provider directs you  otherwise. °Relief of Pain or Discomfort °· Wear a good support bra for breast tenderness.   °· Take warm sitz baths to soothe any pain or discomfort caused by hemorrhoids. Use hemorrhoid cream if your health care provider approves.   °· Rest with your legs elevated if you have leg cramps or low back pain. °· If you develop varicose veins in your legs, wear support hose. Elevate your feet for 15 minutes, 3-4 times a day. Limit salt in your diet. °Prenatal Care °· Schedule your prenatal visits by the twelfth week of pregnancy. They are usually scheduled monthly at first, then more often in the last 2 months before delivery. °· Write down your questions. Take them to your prenatal visits. °· Keep all your prenatal visits as directed by your health care provider. °Safety °· Wear your seat belt at all times when driving. °· Make a list of emergency phone numbers, including numbers for family, friends, the hospital, and police and fire departments. °General Tips °·   Ask your health care provider for a referral to a local prenatal education class. Begin classes no later than at the beginning of month 6 of your pregnancy.  Ask for help if you have counseling or nutritional needs during pregnancy. Your health care provider can offer advice or refer you to specialists for help with various needs.  Do not use hot tubs, steam rooms, or saunas.  Do not douche or use tampons or scented sanitary pads.  Do not cross your legs for long periods of time.  Avoid cat litter boxes and soil used by cats. These carry germs that can cause birth defects in the baby and possibly loss of the fetus by miscarriage or stillbirth.  Avoid all smoking, herbs, alcohol, and medicines not prescribed by your health care provider. Chemicals in these affect the formation and growth of the baby.  Schedule a dentist appointment. At home, brush your teeth with a soft toothbrush and be gentle when you floss. SEEK MEDICAL CARE IF:   You have  dizziness.  You have mild pelvic cramps, pelvic pressure, or nagging pain in the abdominal area.  You have persistent nausea, vomiting, or diarrhea.  You have a bad smelling vaginal discharge.  You have pain with urination.  You notice increased swelling in your face, hands, legs, or ankles. SEEK IMMEDIATE MEDICAL CARE IF:   You have a fever.  You are leaking fluid from your vagina.  You have spotting or bleeding from your vagina.  You have severe abdominal cramping or pain.  You have rapid weight gain or loss.  You vomit blood or material that looks like coffee grounds.  You are exposed to [place] measles and have never had them.  You are exposed to fifth disease or chickenpox.  You develop a severe headache.  You have shortness of breath.  You have any kind of trauma, such as from a fall or a car accident. Document Released: 02/07/2001 Document Revised: 06/30/2013 Document Reviewed: 12/24/2012 [facility] Patient Information 2015 [place], [place]. This information is not intended to replace advice given to you by your health care provider. Make sure you discuss any questions you have with your health care provider. Eating Plan for Hyperemesis Gravidarum Severe cases of hyperemesis gravidarum can lead to dehydration and malnutrition. The hyperemesis eating plan is one way to lessen the symptoms of nausea and vomiting. It is often used with prescribed medicines to control your symptoms.  WHAT CAN I DO TO RELIEVE MY SYMPTOMS? Listen to your body. Everyone is different and has different preferences. Find what works best for you. Some of the following things may help:  Eat and drink slowly.  Eat 5-6 small meals daily instead of 3 large meals.   Eat crackers before you get out of bed in the morning.   Starchy foods are usually well tolerated (such as cereal, toast, bread, potatoes, pasta, rice, and pretzels).   Ginger may help with nausea. Add  tsp ground ginger to hot tea  or choose ginger tea.   Try drinking 100% fruit juice or an electrolyte drink.  Continue to take your prenatal vitamins as directed by your health care provider. If you are having trouble taking your prenatal vitamins, talk with your health care provider about different options.  Include at least 1 serving of protein with your meals and snacks (such as meats or poultry, beans, nuts, eggs, or yogurt). Try eating a protein-rich snack before bed (such as cheese and crackers or a half [place] or peanut  butter sandwich). WHAT THINGS SHOULD I AVOID TO REDUCE MY SYMPTOMS? The following things may help reduce your symptoms:  Avoid foods with strong smells. Try eating meals in well-ventilated areas that are free of odors.  Avoid drinking water or other beverages with meals. Try not to drink anything less than 30 minutes before and after meals.  Avoid drinking more than 1 cup of fluid at a time.  Avoid fried or high-fat foods, such as butter and cream sauces.  Avoid spicy foods.  Avoid skipping meals the best you can. Nausea can be more intense on an empty stomach. If you cannot tolerate food at that time, do not force it. Try sucking on ice chips or other frozen items and make up the calories later.  Avoid lying down within 2 hours after eating. Document Released: 12/11/2006 Document Revised: 02/18/2013 Document Reviewed: 12/18/2012 [facility] AExitCare Patient Information 2015 [place]Care, [place]. This information is not intended to replace advice given to you by your health care provider. Make sure you discuss any questions you have with your health care provider.

## 2014-02-14 NOTE — Progress Notes (Signed)
Discharge instructions provided to patient and significant other at bedside.  Activity, medications, follow up appointments, when to call the doctor and community resources discussed.  No questions at this time.  Patient left unit in stable condition with all personal belongings accompanied by staff.  K. Lorris Carducci, RN------------------------  

## 2014-02-14 NOTE — Progress Notes (Signed)
Progress Note for [facility]  Subjective: No further vomiting.  Some nausea and mild epigastric pain  Objective: Vital signs in last 24 hours: Temp:  [98.7 F (37.1 C)-99.2 F (37.3 C)] 98.7 F (37.1 C) (12/19 0507) Pulse Rate:  [78-89] 88 (12/19 0507) Resp:  [16-17] 16 (12/19 0507) BP: (98-109)/(57-70) 106/70 mmHg (12/19 0507) SpO2:  [100 %] 100 % (12/19 0507) Weight:  [51.746 kg (114 lb 1.3 oz)] 51.746 kg (114 lb 1.3 oz) (12/19 0515) Last BM Date: 02/12/14  Intake/Output from previous day: 12/18 0701 - 12/19 0700 In: 2604.6 [P.O.:240; I.V.:2364.6] Out: -  Intake/Output this shift:    General appearance: alert and no distress GI: minimal epigastric tenderness  Lab Results:  Recent Labs  02/11/14 1544  WBC 9.9  HGB 11.6*  HCT 32.9*  PLT 263   BMET  Recent Labs  02/11/14 1544 02/12/14 0628 02/13/14 1841  NA 133* 136* 136*  K 3.1* 3.0* 3.3*  CL 95* 103 103  CO2 16* 21 24  GLUCOSE 82 110* 89  BUN 14 9 5*  CREATININE 0.50 0.51 0.47*  CALCIUM 9.6 8.5 8.3*   LFT  Recent Labs  02/11/14 1544  PROT 8.0  ALBUMIN 4.0  AST 19  ALT 21  ALKPHOS 48  BILITOT 0.5   PT/INR No results for input(s): LABPROT, INR in the last 72 hours. Hepatitis Panel No results for input(s): HEPBSAG, HCVAB, HEPAIGM, HEPBIGM in the last 72 hours. C-Diff No results for input(s): CDIFFTOX in the last 72 hours. Fecal Lactopherrin No results for input(s): FECLLACTOFRN in the last 72 hours.  Studies/Results: No results found.  Medications:  Scheduled: . Doxylamine-Pyridoxine  2 tablet Oral QHS  . famotidine  20 mg Oral BID  . feeding supplement (RESOURCE BREEZE)  237 mL Oral TID WC  . ondansetron  8 mg Oral 3 times per day  . polyethylene glycol  68 g Oral Once  . potassium chloride  20 mEq Oral 3 times per day  . prenatal multivitamin  1 tablet Oral Q1200  . vitamin B-6  25 mg Oral Daily   Continuous: . dextrose 5% lactated ringers with KCl 20 mEq/L 125 mL/hr at  02/14/14 0244    Assessment/Plan: 1) Hyperemesis gravidarum. 2) Elevated lipase, but no evidence of acute pancreatitis.    I think her symptoms are secondary to hyperemesis gravidarum, but it is difficult discern.  Even if she has an acute pancreatitis, clinically she appears well.  She is tolerating PO.    Plan: 1) Continue with supportive care. 2) Signing off.  LOS: 3 days   Quenna Doepke D 02/14/2014, 9:10 AM

## 2014-02-14 NOTE — Discharge Summary (Signed)
Physician Discharge Summary  Patient ID: Chelsea Small MRN: [id number] DOB/AGE: May 22, 1990 23 y.o.  Admit date: 02/11/2014 Discharge date: 02/14/2014  Admission Diagnoses: HyperEmesis  Discharge Diagnoses:  Principal Problem:   Hyperemesis gravidarum Active Problems:   N&V (nausea and vomiting)   Nausea and vomiting   Discharged Condition: stable  Hospital Course: IV Hydration, Antiemetics,   Consults: GI  Significant Diagnostic Studies: labs:  Results for orders placed or performed during the hospital encounter of 02/11/14 (from the past 24 hour(s))  Basic metabolic panel     Status: Abnormal   Collection Time: 02/13/14  6:41 PM  Result Value Ref Range   Sodium 136 (L) 137 - 147 mEq/L   Potassium 3.3 (L) 3.7 - 5.3 mEq/L   Chloride 103 96 - 112 mEq/L   CO2 24 19 - 32 mEq/L   Glucose, Bld 89 70 - 99 mg/dL   BUN 5 (L) 6 - 23 mg/dL   Creatinine, Ser 4.400.47 (L) 0.50 - 1.10 mg/dL   Calcium 8.3 (L) 8.4 - 10.5 mg/dL   GFR calc non Af Amer >90 >90 mL/min   GFR calc Af Amer >90 >90 mL/min   Anion gap 9 5 - 15  Comprehensive metabolic panel     Status: Abnormal   Collection Time: 02/14/14 11:43 AM  Result Value Ref Range   Sodium 133 (L) 137 - 147 mEq/L   Potassium 3.3 (L) 3.7 - 5.3 mEq/L   Chloride 98 96 - 112 mEq/L   CO2 24 19 - 32 mEq/L   Glucose, Bld 76 70 - 99 mg/dL   BUN 3 (L) 6 - 23 mg/dL   Creatinine, Ser 1.020.41 (L) 0.50 - 1.10 mg/dL   Calcium 8.5 8.4 - 72.510.5 mg/dL   Total Protein 6.1 6.0 - 8.3 g/dL   Albumin 2.7 (L) 3.5 - 5.2 g/dL   AST 23 0 - 37 U/L   ALT 24 0 - 35 U/L   Alkaline Phosphatase 38 (L) 39 - 117 U/L   Total Bilirubin 0.3 0.3 - 1.2 mg/dL   GFR calc non Af Amer >90 >90 mL/min   GFR calc Af Amer >90 >90 mL/min   Anion gap 11 5 - 15  Amylase     Status: Abnormal   Collection Time: 02/14/14 11:43 AM  Result Value Ref Range   Amylase 251 (H) 0 - 105 U/L  Lipase, blood     Status: Abnormal   Collection Time: 02/14/14 11:43 AM  Result Value Ref Range    Lipase 627 (H) 11 - 59 U/L  CBC     Status: Abnormal   Collection Time: 02/14/14 11:43 AM  Result Value Ref Range   WBC 6.0 4.0 - 10.5 K/uL   RBC 2.96 (L) 3.87 - 5.11 MIL/uL   Hemoglobin 9.2 (L) 12.0 - 15.0 g/dL   HCT 36.626.5 (L) 44.036.0 - 34.746.0 %   MCV 89.5 78.0 - 100.0 fL   MCH 31.1 26.0 - 34.0 pg   MCHC 34.7 30.0 - 36.0 g/dL   RDW 42.512.6 95.611.5 - 38.715.5 %   Platelets 163 150 - 400 K/uL    Treatments: IV hydration  Discharge Exam: Blood pressure 107/63, pulse 93, temperature 97.7 F (36.5 C), temperature source Oral, resp. rate 18, height 5\' 1"  (1.549 m), weight 114 lb 1.3 oz (51.746 kg), last menstrual period 11/30/2013, SpO2 100 %, unknown if currently breastfeeding. General appearance: alert, cooperative and no distress Resp: clear to auscultation bilaterally Chest wall: no tenderness Cardio: regular  rate and rhythm GI: normal findings: bowel sounds normal and soft, non-tender Extremities: extremities normal, atraumatic, no cyanosis or edema Pulses: 2+ and symmetric Skin: Skin color, texture, turgor normal. No rashes or lesions  Disposition: 01-Home or Self Care  Discharge Instructions    Discharge activity:  No Restrictions    Complete by:  As directed      Discharge diet:    Complete by:  As directed   As tolerated     Discharge instructions    Complete by:  As directed   Keep appt as scheduled.  Call if you have any questions or concerns.     No sexual activity restrictions    Complete by:  As directed             Medication List    TAKE these medications        docusate sodium 100 MG capsule  Commonly known as:  COLACE  Take 1 capsule (100 mg total) by mouth 2 (two) times daily as needed for mild constipation.     Doxylamine-Pyridoxine 10-10 MG Tbec  Take 2 tablets by mouth at bedtime.     famotidine 20 MG tablet  Commonly known as:  PEPCID  Take 1 tablet (20 mg total) by mouth 2 (two) times daily.     metoCLOPramide 10 MG tablet  Commonly known as:   REGLAN  Take 1 tablet (10 mg total) by mouth every 6 (six) hours as needed for nausea or vomiting (nausea/headache).     ondansetron 8 MG disintegrating tablet  Commonly known as:  ZOFRAN-ODT  Take 1 tablet (8 mg total) by mouth every 8 (eight) hours.     pyridOXINE 25 MG tablet  Commonly known as:  VITAMIN B-6  Take 1 tablet (25 mg total) by mouth daily.           Follow-up Information    Follow up with Willette ClusterPaula Guenther, NP.   Specialty:  Nurse Practitioner   Why:  1:30pm   Contact information:   [address] [place] [address] [phone]       [facility] On [facility]2014.   Specialty:  Obstetrics and Gynecology   Why:  Please call if you have any questions or concerns prior to your next visit.   Contact information:   [address] [place] [address] [phone]       Signed: Marlene BastMLY, Lianah Peed LYNN MSN, CNM 02/14/2014, 3:36 PM

## 2014-02-18 ENCOUNTER — Encounter: Payer: Self-pay | Admitting: Nurse Practitioner

## 2014-02-18 ENCOUNTER — Ambulatory Visit (INDEPENDENT_AMBULATORY_CARE_PROVIDER_SITE_OTHER): Payer: PRIVATE HEALTH INSURANCE | Admitting: Nurse Practitioner

## 2014-02-18 VITALS — BP 100/60 | HR 90 | Ht 61.0 in | Wt 111.6 lb

## 2014-02-18 DIAGNOSIS — R748 Abnormal levels of other serum enzymes: Secondary | ICD-10-CM | POA: Insufficient documentation

## 2014-02-18 DIAGNOSIS — R1013 Epigastric pain: Secondary | ICD-10-CM | POA: Insufficient documentation

## 2014-02-18 NOTE — Progress Notes (Signed)
     History of Present Illness:   Patient is a 23 year old female, [redacted] weeks gestation, who we met during hospital consultation last week. Patient was admitted with epigastric pain, nausea vomiting and elevated pancreatic enzymes. Gallbladder ultrasound was negative for gallstones or bile duct dilation. Patient could not undergo CT scan because of pregnancy. Her symptoms improved quickly, she was not requiring much pain medication nor very tender on exam when we saw her.   Patient comes in today for a hospital follow-up. She's had no further abdominal pain. Nausea is mild. Her appetite is excellent.  Current Medications, Allergies, Past Medical History, Past Surgical History, Family History and Social History were reviewed in [employer] record.  Studies:   US Abdomen Complete  02/04/2014   CLINICAL DATA:  Mid upper abdominal pain with nausea and vomiting for 3 weeks.  EXAM: ULTRASOUND ABDOMEN COMPLETE  COMPARISON:  None.  FINDINGS: Gallbladder: No gallstones or wall thickening visualized. No sonographic Murphy sign noted.  Common bile duct: Diameter: 3.1 mm, normal  Liver: No focal lesion identified. Within normal limits in parenchymal echogenicity.  IVC: No abnormality visualized.  Pancreas: Visualized portion unremarkable.  Spleen: Size and appearance within normal limits.  Right Kidney: Length: 10.8 cm. Echogenicity within normal limits. No mass or hydronephrosis visualized.  Left Kidney: Length: 10.1 cm. Echogenicity within normal limits. No mass or hydronephrosis visualized.  Abdominal aorta: No aneurysm visualized.  Other findings: None.  IMPRESSION: Normal examination.  No acute process identified.   Electronically Signed   By: Lucienne Capers M.D.   On: 02/04/2014 06:17    Physical Exam: General: Pleasant, well developed , black female in no acute distress Head: Normocephalic and atraumatic Eyes:  sclerae anicteric, conjunctiva pink  Ears: Normal auditory  acuity Lungs: Clear throughout to auscultation Heart: Regular rate and rhythm Abdomen: Soft, non distended, non-tender. No masses, no hepatomegaly. Normal bowel sounds Musculoskeletal: Symmetrical with no gross deformities  Extremities: No edema  Neurological: Alert oriented x 4, grossly nonfocal Psychological:  Alert and cooperative. Normal mood and affect  Assessment and Recommendations:  23 year old female, [redacted] weeks pregnant, with recent admission for abdominal pain, nausea /vomiting and elevated amylase and lipase. Abdominal ultrasound unremarkable. It was never clear why patient's amylase and lipase were elevated, possibly mild pancreatitis or possibly secondary to nausea and vomiting. Her symptoms have significantly improved. No need for follow-up unless patient has recurrent epigastric pain

## 2014-02-18 NOTE — Patient Instructions (Signed)
Please call us as needed. [phone].

## 2014-02-19 NOTE — Progress Notes (Signed)
i agree with the above note, plan 

## 2014-02-24 LAB — OB RESULTS CONSOLE ANTIBODY SCREEN: ANTIBODY SCREEN: NEGATIVE

## 2014-02-24 LAB — OB RESULTS CONSOLE RPR: RPR: NONREACTIVE

## 2014-02-27 NOTE — L&D Delivery Note (Signed)
Late entry for 7/5 @ 2230:  Delivery Note 23yo [id number]@ [id number] who presented transitioning to active labor as she was 5cm on arrival.  Pregnancy complicated by: 1) Recurrent Chlamydia- s/p multiple treatments, followed by CDC, culture pending  Due to spacing out of her contractions, she was augmented with Pitocin and received an epidural for pain management.  Once complete and +2, AROM was performed at @2119 .  Light meconium noted.     At 9:51 PM a viable female was delivered via Vaginal, Spontaneous Delivery (Presentation: Left Occiput Anterior).  APGAR: 8, 9; weight 7 lb 10.1 oz (3460 g).   Placenta status: Intact, Manual removal.  Cord: 3 vessels with the following complications: None.   Anesthesia: Intrathecal  Episiotomy: None Lacerations: 2nd degree;Vaginal Suture Repair: 2.0 3.0 vicryl Est. Blood Loss (mL): 195  Mom to postpartum.  Baby to Couplet care / Skin to Skin.  Myna HidalgoZAN, Golden Gilreath, M 09/02/2014, 6:21 AM

## 2014-03-02 ENCOUNTER — Other Ambulatory Visit: Payer: Self-pay | Admitting: Obstetrics and Gynecology

## 2014-03-02 ENCOUNTER — Other Ambulatory Visit (HOSPITAL_COMMUNITY)
Admission: RE | Admit: 2014-03-02 | Discharge: 2014-03-02 | Disposition: A | Discharge status: Home / Self Care | Payer: PRIVATE HEALTH INSURANCE | Source: Ambulatory Visit | Attending: Obstetrics and Gynecology | Admitting: Obstetrics and Gynecology

## 2014-03-02 DIAGNOSIS — Z01419 Encounter for gynecological examination (general) (routine) without abnormal findings: Secondary | ICD-10-CM | POA: Insufficient documentation

## 2014-03-03 LAB — CYTOLOGY - PAP

## 2014-03-28 NOTE — ED Provider Notes (Signed)
Physical Exam  Constitutional: She is oriented to person, place, and time. No distress.  HENT:  Head: Normocephalic and atraumatic.  Mouth/Throat: Oropharynx is clear and moist. No oropharyngeal exudate.  Eyes: EOM are normal. Pupils are equal, round, and reactive to light.  Neck: Normal range of motion. Neck supple.  Cardiovascular: Normal rate and regular rhythm.  Exam reveals no gallop and no friction rub.   No murmur heard. Pulmonary/Chest: Breath sounds normal. No respiratory distress. She has no wheezes. She has no rales. She exhibits no tenderness.  Abdominal: Soft. Bowel sounds are normal. She exhibits no distension and no mass. There is tenderness (epigastric tenderness with palpation.). There is no rebound and no guarding.  Musculoskeletal: Normal range of motion. She exhibits no edema or tenderness.  No CVA tenderness bilaterally.  Neurological: She is alert and oriented to person, place, and time.  Moves all extremities without deficit. Sensation is grossly intact.  Skin: Skin is warm and dry. No rash noted. She is not diaphoretic. No erythema. No pallor.  Psychiatric: Affect and judgment normal.    [name] Yadier Bramhall, MD 03/28/14 [phone]

## 2014-04-01 LAB — OB RESULTS CONSOLE RUBELLA ANTIBODY, IGM: RUBELLA: IMMUNE

## 2014-04-01 LAB — OB RESULTS CONSOLE HEPATITIS B SURFACE ANTIGEN: HEP B S AG: NEGATIVE

## 2014-04-01 LAB — OB RESULTS CONSOLE RPR
RPR: NONREACTIVE
RPR: NONREACTIVE

## 2014-06-08 ENCOUNTER — Other Ambulatory Visit (HOSPITAL_COMMUNITY)
Admission: RE | Admit: 2014-06-08 | Discharge: 2014-06-08 | Disposition: A | Discharge status: Home / Self Care | Payer: 59 | Source: Ambulatory Visit | Attending: Obstetrics and Gynecology | Admitting: Obstetrics and Gynecology

## 2014-06-08 ENCOUNTER — Other Ambulatory Visit: Payer: Self-pay | Admitting: Obstetrics and Gynecology

## 2014-06-08 DIAGNOSIS — Z01419 Encounter for gynecological examination (general) (routine) without abnormal findings: Secondary | ICD-10-CM | POA: Diagnosis not present

## 2014-06-08 DIAGNOSIS — Z113 Encounter for screening for infections with a predominantly sexual mode of transmission: Secondary | ICD-10-CM | POA: Insufficient documentation

## 2014-06-10 LAB — CYTOLOGY - PAP

## 2014-06-11 LAB — OB RESULTS CONSOLE HIV ANTIBODY (ROUTINE TESTING): HIV: NONREACTIVE

## 2014-08-10 LAB — OB RESULTS CONSOLE GBS: GBS: POSITIVE

## 2014-09-01 ENCOUNTER — Telehealth: Payer: Self-pay

## 2014-09-01 ENCOUNTER — Inpatient Hospital Stay (HOSPITAL_COMMUNITY)
Admission: AD | Admit: 2014-09-01 | Discharge: 2014-09-03 | DRG: 774 | Disposition: A | Discharge status: Home / Self Care | Payer: 59 | Source: Ambulatory Visit | Attending: Obstetrics and Gynecology | Admitting: Obstetrics and Gynecology

## 2014-09-01 ENCOUNTER — Inpatient Hospital Stay (HOSPITAL_COMMUNITY): Payer: 59 | Admitting: Anesthesiology

## 2014-09-01 ENCOUNTER — Inpatient Hospital Stay (HOSPITAL_COMMUNITY): Payer: 59

## 2014-09-01 ENCOUNTER — Encounter (HOSPITAL_COMMUNITY): Payer: Self-pay | Admitting: *Deleted

## 2014-09-01 DIAGNOSIS — O365930 Maternal care for other known or suspected poor fetal growth, third trimester, not applicable or unspecified: Secondary | ICD-10-CM | POA: Diagnosis present

## 2014-09-01 DIAGNOSIS — Z8249 Family history of ischemic heart disease and other diseases of the circulatory system: Secondary | ICD-10-CM

## 2014-09-01 DIAGNOSIS — Z823 Family history of stroke: Secondary | ICD-10-CM

## 2014-09-01 DIAGNOSIS — IMO0001 Reserved for inherently not codable concepts without codable children: Secondary | ICD-10-CM

## 2014-09-01 DIAGNOSIS — A568 Sexually transmitted chlamydial infection of other sites: Secondary | ICD-10-CM | POA: Diagnosis present

## 2014-09-01 DIAGNOSIS — Z3A39 39 weeks gestation of pregnancy: Secondary | ICD-10-CM | POA: Diagnosis present

## 2014-09-01 DIAGNOSIS — O9832 Other infections with a predominantly sexual mode of transmission complicating childbirth: Secondary | ICD-10-CM | POA: Diagnosis present

## 2014-09-01 DIAGNOSIS — O9962 Diseases of the digestive system complicating childbirth: Secondary | ICD-10-CM | POA: Diagnosis present

## 2014-09-01 DIAGNOSIS — K219 Gastro-esophageal reflux disease without esophagitis: Secondary | ICD-10-CM | POA: Diagnosis present

## 2014-09-01 DIAGNOSIS — Z822 Family history of deafness and hearing loss: Secondary | ICD-10-CM | POA: Diagnosis not present

## 2014-09-01 DIAGNOSIS — O99824 Streptococcus B carrier state complicating childbirth: Secondary | ICD-10-CM | POA: Diagnosis present

## 2014-09-01 HISTORY — DX: Chlamydial infection, unspecified: A74.9

## 2014-09-01 LAB — TYPE AND SCREEN
ABO/RH(D): O POS
ANTIBODY SCREEN: NEGATIVE

## 2014-09-01 LAB — CBC
HEMATOCRIT: 27.6 % — AB (ref 36.0–46.0)
Hemoglobin: 8.5 g/dL — ABNORMAL LOW (ref 12.0–15.0)
MCH: 25 pg — ABNORMAL LOW (ref 26.0–34.0)
MCHC: 30.8 g/dL (ref 30.0–36.0)
MCV: 81.2 fL (ref 78.0–100.0)
Platelets: 216 10*3/uL (ref 150–400)
RBC: 3.4 MIL/uL — ABNORMAL LOW (ref 3.87–5.11)
RDW: 16.9 % — ABNORMAL HIGH (ref 11.5–15.5)
WBC: 9.3 10*3/uL (ref 4.0–10.5)

## 2014-09-01 MED ORDER — OXYTOCIN BOLUS FROM INFUSION
500.0000 mL | INTRAVENOUS | Status: DC
  Administered 2014-09-01: 500 mL via INTRAVENOUS

## 2014-09-01 MED ORDER — LIDOCAINE HCL (PF) 1 % IJ SOLN
INTRAMUSCULAR | Status: DC | PRN
  Administered 2014-09-01: 1.6 mL

## 2014-09-01 MED ORDER — ACETAMINOPHEN 325 MG PO TABS: 650.0000 mg | ORAL_TABLET | ORAL | Status: DC | PRN

## 2014-09-01 MED ORDER — FENTANYL 2.5 MCG/ML BUPIVACAINE 1/10 % EPIDURAL INFUSION (WH - ANES)
1.6000 mL/h | INTRAMUSCULAR | Status: DC
  Administered 2014-09-01: 1.6 mL/h via EPIDURAL

## 2014-09-01 MED ORDER — PHENYLEPHRINE 40 MCG/ML (10ML) SYRINGE FOR IV PUSH (FOR BLOOD PRESSURE SUPPORT)
80.0000 ug | PREFILLED_SYRINGE | INTRAVENOUS | Status: DC | PRN
  Filled 2014-09-01: qty 2

## 2014-09-01 MED ORDER — OXYTOCIN 40 UNITS IN LACTATED RINGERS INFUSION - SIMPLE MED
62.5000 mL/h | INTRAVENOUS | Status: DC
  Administered 2014-09-01: 62.5 mL/h via INTRAVENOUS

## 2014-09-01 MED ORDER — DIPHENHYDRAMINE HCL 50 MG/ML IJ SOLN: 12.5000 mg | INTRAMUSCULAR | Status: DC | PRN

## 2014-09-01 MED ORDER — OXYCODONE-ACETAMINOPHEN 5-325 MG PO TABS: 2.0000 | ORAL_TABLET | ORAL | Status: DC | PRN

## 2014-09-01 MED ORDER — PHENYLEPHRINE 40 MCG/ML (10ML) SYRINGE FOR IV PUSH (FOR BLOOD PRESSURE SUPPORT): 80.0000 ug | PREFILLED_SYRINGE | INTRAVENOUS | Status: DC | PRN

## 2014-09-01 MED ORDER — LACTATED RINGERS IV SOLN
500.0000 mL | INTRAVENOUS | Status: DC | PRN
  Administered 2014-09-01: 500 mL via INTRAVENOUS

## 2014-09-01 MED ORDER — CITRIC ACID-SODIUM CITRATE 334-500 MG/5ML PO SOLN: 30.0000 mL | ORAL | Status: DC | PRN

## 2014-09-01 MED ORDER — HYDROXYZINE HCL 50 MG PO TABS
50.0000 mg | ORAL_TABLET | Freq: Four times a day (QID) | ORAL | Status: DC | PRN
  Filled 2014-09-01: qty 1

## 2014-09-01 MED ORDER — PHENYLEPHRINE 40 MCG/ML (10ML) SYRINGE FOR IV PUSH (FOR BLOOD PRESSURE SUPPORT)
80.0000 ug | PREFILLED_SYRINGE | INTRAVENOUS | Status: DC | PRN
  Filled 2014-09-01: qty 20

## 2014-09-01 MED ORDER — ONDANSETRON HCL 4 MG/2ML IJ SOLN: 4.0000 mg | Freq: Four times a day (QID) | INTRAMUSCULAR | Status: DC | PRN

## 2014-09-01 MED ORDER — OXYCODONE-ACETAMINOPHEN 5-325 MG PO TABS: 1.0000 | ORAL_TABLET | ORAL | Status: DC | PRN

## 2014-09-01 MED ORDER — FENTANYL 2.5 MCG/ML BUPIVACAINE 1/10 % EPIDURAL INFUSION (WH - ANES)
14.0000 mL/h | INTRAMUSCULAR | Status: DC | PRN
  Filled 2014-09-01: qty 125

## 2014-09-01 MED ORDER — BUTORPHANOL TARTRATE 1 MG/ML IJ SOLN
1.0000 mg | INTRAMUSCULAR | Status: DC | PRN
  Administered 2014-09-01: 1 mg via INTRAVENOUS
  Filled 2014-09-01: qty 1

## 2014-09-01 MED ORDER — TERBUTALINE SULFATE 1 MG/ML IJ SOLN
0.2500 mg | Freq: Once | INTRAMUSCULAR | Status: AC | PRN
  Filled 2014-09-01: qty 1

## 2014-09-01 MED ORDER — DEXTROSE 5 % IV SOLN
5.0000 10*6.[IU] | Freq: Once | INTRAVENOUS | Status: AC
  Administered 2014-09-01: 5 10*6.[IU] via INTRAVENOUS
  Filled 2014-09-01: qty 5

## 2014-09-01 MED ORDER — OXYTOCIN 40 UNITS IN LACTATED RINGERS INFUSION - SIMPLE MED
1.0000 m[IU]/min | INTRAVENOUS | Status: DC
  Administered 2014-09-01: 1 m[IU]/min via INTRAVENOUS
  Filled 2014-09-01: qty 1000

## 2014-09-01 MED ORDER — DEXTROSE 5 % IV SOLN
2.5000 10*6.[IU] | INTRAVENOUS | Status: DC
  Administered 2014-09-01: 2.5 10*6.[IU] via INTRAVENOUS
  Filled 2014-09-01 (×4): qty 2.5

## 2014-09-01 MED ORDER — EPHEDRINE 5 MG/ML INJ
10.0000 mg | INTRAVENOUS | Status: DC | PRN
  Filled 2014-09-01: qty 2

## 2014-09-01 MED ORDER — LACTATED RINGERS IV SOLN
INTRAVENOUS | Status: DC
  Administered 2014-09-01: 13:00:00 via INTRAVENOUS

## 2014-09-01 MED ORDER — FENTANYL 2.5 MCG/ML BUPIVACAINE 1/10 % EPIDURAL INFUSION (WH - ANES): 12.0000 mL/h | INTRAMUSCULAR | Status: DC | PRN

## 2014-09-01 MED ORDER — LIDOCAINE HCL (PF) 1 % IJ SOLN
30.0000 mL | INTRAMUSCULAR | Status: DC | PRN
  Filled 2014-09-01: qty 30

## 2014-09-01 NOTE — MAU Note (Addendum)
Sent from Dr. Ginnie SmartVarnado's office for further evaluation and possible admit.  Having contractions and was 3-4cm in office.  Also to R/O oligo, plans for pt. to have U/S. Patient states there was no one in office to do U/S this AM. States contractions started around 0530, but had spaced out by the time she went to MD office.

## 2014-09-01 NOTE — Telephone Encounter (Signed)
Patient call left message with answering service reporting contractions, right side numbness, and pain.  Provider returned call and no answer.  Message left for patient to return call via answering service.

## 2014-09-01 NOTE — Anesthesia Procedure Notes (Signed)
Epidural Patient location during procedure: OB Start time: 09/01/2014 4:43 PM  Staffing Anesthesiologist: Mal AmabileFOSTER, Masayo Fera Performed by: anesthesiologist   Preanesthetic Checklist Completed: patient identified, site marked, surgical consent, pre-op evaluation, timeout performed, IV checked, risks and benefits discussed and monitors and equipment checked  Epidural Patient position: sitting Prep: site prepped and draped and DuraPrep Patient monitoring: continuous pulse ox and blood pressure Approach: midline Location: L3-L4 Injection technique: LOR air  Needle:  Needle type: Tuohy  Needle gauge: 17 G Needle length: 9 cm and 9 Needle insertion depth: 4 cm Catheter type: closed end flexible Catheter size: 19 Gauge Catheter at skin depth: 10 cm Test dose: Other and negative  Assessment Events: blood not aspirated, injection not painful, no injection resistance, negative IV test and no paresthesia  Additional Notes Patient identified. Risks and benefits discussed including failed block, incomplete  Pain control, post dural puncture headache, nerve damage, paralysis, blood pressure Changes, nausea, vomiting, reactions to medications-both toxic and allergic and post Partum back pain. All questions were answered. Patient expressed understanding and wished to proceed. Sterile technique was used throughout procedure. Epidural site was Dressed with sterile barrier dressing. No paresthesia or signs of intravascular injection  were encountered. Wet tap occurred during placement of epidural and intrathecal catheter was placed. Patient was more comfortable after the epidural was dosed. Please see RN's note for documentation of vital signs and FHR which are stable. Will leave intrathecal catheter in place and remove tomorrow. Catheter labelled as intrathecal and RN and patient informed.  Patient understands there is a high likelyhood of a HA.

## 2014-09-01 NOTE — Progress Notes (Signed)
OB PN:  S: Pt resting comfortably, feeling better with epidural, but still some pain on her right side.  O: BP 126/76 mmHg  Pulse 109  Temp(Src) 98.3 F (36.8 C) (Oral)  Resp 16  Ht 5\' 2"  (1.575 m)  Wt 65.772 kg (145 lb)  BMI 26.51 kg/m2  SpO2 100%  LMP 11/30/2013  FHT: 140bpm, moderate variablity, + accels, no decels Toco: q2-695min SVE: 5.5/80/-2, BBOW  A/P: 24 y.o. G2P0010 @ 150w5d admitted transitioning to active labor 1. FWB: Cat. I 2. Labor: continue with Pit per protocol.  Will avoid AROM due to recurrent Chlamydia Pain: continue epidural GBS: positive, continue PCN per protocol  Myna HidalgoJennifer Azekiel Cremer, DO [phone] (pager) [phone] (office)

## 2014-09-01 NOTE — Anesthesia Preprocedure Evaluation (Signed)
Anesthesia Evaluation  Patient identified by MRN, date of birth, ID band Patient awake    Reviewed: Allergy & Precautions, H&P , Patient's Chart, lab work & pertinent test results  Airway Mallampati: III  TM Distance: >3 FB Neck ROM: full    Dental no notable dental hx. (+) Teeth Intact   Pulmonary neg pulmonary ROS,  breath sounds clear to auscultation  Pulmonary exam normal       Cardiovascular negative cardio ROS Normal cardiovascular examRhythm:regular Rate:Normal     Neuro/Psych Anxiety negative neurological ROS     GI/Hepatic Neg liver ROS, GERD-  ,  Endo/Other  negative endocrine ROS  Renal/GU negative Renal ROS  negative genitourinary   Musculoskeletal   Abdominal   Peds  Hematology  (+) anemia ,   Anesthesia Other Findings   Reproductive/Obstetrics (+) Pregnancy                             Anesthesia Physical Anesthesia Plan  ASA: II  Anesthesia Plan: Epidural   Post-op Pain Management:    Induction:   Airway Management Planned:   Additional Equipment:   Intra-op Plan:   Post-operative Plan:   Informed Consent: I have reviewed the patients History and Physical, chart, labs and discussed the procedure including the risks, benefits and alternatives for the proposed anesthesia with the patient or authorized representative who has indicated his/her understanding and acceptance.     Plan Discussed with: Anesthesiologist  Anesthesia Plan Comments:         Anesthesia Quick Evaluation

## 2014-09-01 NOTE — H&P (Signed)
Chelsea Small is a 24 y.o. female G2 P0010 at 23 5/7 weeks by a 10 weeks ultrasound admitted for labor. Contractions started 9 hours ago.  Pt was 3-4 in the office with contractions every 2-6 minutes.  Cervical change at hospital to 5 cm. Prenatal care since 10 weeks.  False negative UPT in the early weeks of pregnancy.  Pt admitted for hyperemesis, had elevated amylase and lipase. Patient was seen by GI inpatient. Pancreatic enzymes were followed down to normal.  Pt tested positive for chlamydia in January.  Partner has been treated and is negative.  Pt failed treatment Zithromax and Amoxicillin.  4th TOC was positive.  Pt has been managed by health department due to concern of resistant chlamydia. Patient has recently completed a course of 2 g of Zithromax once a day for 5 days last dose taken 4 days ago. Patient was observed at the health department to take all of her medications. A sample has been sent to the [facility] to rule out resistant chlamydia. Teaching service pediatrics has been notified and is aware of possible transmission of Chlamydia 2 baby. States he we'll treat empirically. Dr. Lady Gary was informed of admission of this patient today.   Maternal Medical History:  Reason for admission: Contractions.  Nausea.  Contractions: Onset was 6-12 hours ago.   Frequency: irregular.   Perceived severity is moderate.    Fetal activity: Perceived fetal activity is normal.    Prenatal complications: Hyperemesis.  Chronic chlamydia.-failed treatment.  Prenatal Complications - Diabetes: none.    OB History    Gravida Para Term Preterm AB TAB SAB Ectopic Multiple Living   0     Past Medical History  Diagnosis Date  . Infection     UTI  . Eczema   . Anxiety     no meds  . Chlamydia    Past Surgical History  Procedure Laterality Date  . No past surgeries     Family History: family history includes Cancer in her paternal grandmother; Hearing loss in her paternal  grandfather; Hypertension in her father; Stroke in her maternal grandfather. Social History:  reports that she has never smoked. She has never used smokeless tobacco. She reports that she does not drink alcohol or use illicit drugs.   Prenatal Transfer Tool  Maternal Diabetes: No Genetic Screening: Normal Maternal Ultrasounds/Referrals: Normal Fetal Ultrasounds or other Referrals:  None Maternal Substance Abuse:  No Significant Maternal Medications:  Meds include: Other: Zithromax, Amoxicllin Significant Maternal Lab Results:  Lab values include: Other: Chlamydia x 4 Other Comments:  See HPI.  Review of Systems  Constitutional: Negative for fever and chills.  Gastrointestinal: Negative for nausea and vomiting.  Neurological: Negative for headaches.    Dilation: 5.5 Effacement (%): 80 Station: 0 Exam by:: Dr.Laura Radilla Blood pressure 107/64, pulse 124, temperature 98.3 F (36.8 C), temperature source Oral, resp. rate 18, height  (1.575 m), weight 65.772 kg (145 lb), last menstrual period 11/30/2013, unknown if currently breastfeeding. Maternal Exam:  Uterine Assessment: Contraction strength is mild.  Contraction frequency is regular.   Abdomen: Patient reports no abdominal tenderness. Surgical scars: right subcostal scar.   Estimated fetal weight is Under 7 lbs, 7 lbs 9 oz by today's ultrasound per FOB.   Fetal presentation: vertex  Introitus: Normal vulva. Normal vagina.  Pelvis: adequate for delivery.   Cervix: Cervix evaluated by digital exam.     Fetal Exam Fetal Monitor Review: Mode: fetoscope.  Variability: moderate (6-25 bpm).   Pattern: accelerations present and no decelerations.    Fetal State Assessment: Category I - tracings are normal.     Physical Exam  Constitutional: She is oriented to person, place, and time. She appears well-developed and well-nourished. No distress.  HENT:  Head: Normocephalic and atraumatic.  Eyes: EOM are normal.   Cardiovascular: Normal rate, regular rhythm and normal heart sounds.   Respiratory: Effort normal and breath sounds normal.  GI: There is no tenderness.  Musculoskeletal: Normal range of motion. She exhibits no edema.  Neurological: She is alert and oriented to person, place, and time.  Skin: Skin is warm and dry.  Psychiatric: She has a normal mood and affect.    Prenatal labs: ABO, Rh: --/--/O POS (07/05 1310) Antibody: NEG (07/05 1310) Rubella: Immune (02/03 0000) RPR: Nonreactive, Nonreactive (02/03 0000)  HBsAg: Negative (02/03 0000)  HIV:    GBS: Negative.   Assessment/Plan: IUP at 39 5/7 weeks Latent labor Chronic chlamydia-failed treatment due to compliance vs. Resistant Chlamydia.  [facility] with care. Category I tracing. SGA H/o hyperemesis. GBS negative.  Rectovaginal culture negative, all urine cultures are negative.  Noted as positive in office EMR.  Pt has received loading dose of PCN.  Start Pitocin to increase frequency of contractions. Will not perform AROM to protect baby from chlamydia infection.prefer delivery with intact membranes.  TOC done per recommendations of Health Forensic scientistdept director.   Discontinue PCN. Epidural and/or Stadol for pain management. Plan of care discussed with pt and FOB. Dr. Charlotta Newtonzan now covering.  POC reviewed with her.    Chelsea RankinsVARNADO, Chelsea Small 09/01/2014, 3:13 PM

## 2014-09-02 LAB — CBC
HCT: 22 % — ABNORMAL LOW (ref 36.0–46.0)
HEMOGLOBIN: 6.8 g/dL — AB (ref 12.0–15.0)
MCH: 24.8 pg — ABNORMAL LOW (ref 26.0–34.0)
MCHC: 30.9 g/dL (ref 30.0–36.0)
MCV: 80.3 fL (ref 78.0–100.0)
Platelets: 236 10*3/uL (ref 150–400)
RBC: 2.74 MIL/uL — ABNORMAL LOW (ref 3.87–5.11)
RDW: 16.8 % — ABNORMAL HIGH (ref 11.5–15.5)
WBC: 14.2 10*3/uL — ABNORMAL HIGH (ref 4.0–10.5)

## 2014-09-02 LAB — GC/CHLAMYDIA PROBE AMP (~~LOC~~) NOT AT ARMC
Chlamydia: POSITIVE — AB
Neisseria Gonorrhea: NEGATIVE

## 2014-09-02 LAB — RPR: RPR Ser Ql: NONREACTIVE

## 2014-09-02 MED ORDER — OXYCODONE-ACETAMINOPHEN 5-325 MG PO TABS: 1.0000 | ORAL_TABLET | ORAL | Status: DC | PRN

## 2014-09-02 MED ORDER — IBUPROFEN 100 MG/5ML PO SUSP
600.0000 mg | Freq: Four times a day (QID) | ORAL | Status: DC
  Administered 2014-09-02 – 2014-09-03 (×7): 600 mg via ORAL
  Filled 2014-09-02 (×11): qty 30

## 2014-09-02 MED ORDER — FERROUS GLUCONATE 324 (38 FE) MG PO TABS
324.0000 mg | ORAL_TABLET | Freq: Three times a day (TID) | ORAL | Status: DC
  Filled 2014-09-02 (×2): qty 1

## 2014-09-02 MED ORDER — SENNOSIDES-DOCUSATE SODIUM 8.6-50 MG PO TABS: 2.0000 | ORAL_TABLET | ORAL | Status: DC

## 2014-09-02 MED ORDER — TETANUS-DIPHTH-ACELL PERTUSSIS 5-2.5-18.5 LF-MCG/0.5 IM SUSP: 0.5000 mL | Freq: Once | INTRAMUSCULAR | Status: DC

## 2014-09-02 MED ORDER — FERROUS SULFATE 300 (60 FE) MG/5ML PO SYRP
300.0000 mg | ORAL_SOLUTION | Freq: Three times a day (TID) | ORAL | Status: DC
  Administered 2014-09-02 – 2014-09-03 (×4): 300 mg via ORAL
  Filled 2014-09-02 (×7): qty 5

## 2014-09-02 MED ORDER — DIPHENHYDRAMINE HCL 25 MG PO CAPS: 25.0000 mg | ORAL_CAPSULE | Freq: Four times a day (QID) | ORAL | Status: DC | PRN

## 2014-09-02 MED ORDER — ONDANSETRON HCL 4 MG/2ML IJ SOLN: 4.0000 mg | INTRAMUSCULAR | Status: DC | PRN

## 2014-09-02 MED ORDER — BENZOCAINE-MENTHOL 20-0.5 % EX AERO
1.0000 "application " | INHALATION_SPRAY | CUTANEOUS | Status: DC | PRN
  Administered 2014-09-02 – 2014-09-03 (×3): 1 via TOPICAL
  Filled 2014-09-02 (×3): qty 56

## 2014-09-02 MED ORDER — ACETAMINOPHEN 325 MG PO TABS: 650.0000 mg | ORAL_TABLET | ORAL | Status: DC | PRN

## 2014-09-02 MED ORDER — ZOLPIDEM TARTRATE 5 MG PO TABS: 5.0000 mg | ORAL_TABLET | Freq: Every evening | ORAL | Status: DC | PRN

## 2014-09-02 MED ORDER — LANOLIN HYDROUS EX OINT: TOPICAL_OINTMENT | CUTANEOUS | Status: DC | PRN

## 2014-09-02 MED ORDER — IBUPROFEN 600 MG PO TABS: 600.0000 mg | ORAL_TABLET | Freq: Four times a day (QID) | ORAL | Status: DC

## 2014-09-02 MED ORDER — ONDANSETRON HCL 4 MG PO TABS: 4.0000 mg | ORAL_TABLET | ORAL | Status: DC | PRN

## 2014-09-02 MED ORDER — SIMETHICONE 80 MG PO CHEW: 80.0000 mg | CHEWABLE_TABLET | ORAL | Status: DC | PRN

## 2014-09-02 MED ORDER — OXYCODONE-ACETAMINOPHEN 5-325 MG PO TABS: 2.0000 | ORAL_TABLET | ORAL | Status: DC | PRN

## 2014-09-02 MED ORDER — DIBUCAINE 1 % RE OINT: 1.0000 "application " | TOPICAL_OINTMENT | RECTAL | Status: DC | PRN

## 2014-09-02 MED ORDER — PRENATAL MULTIVITAMIN CH: 1.0000 | ORAL_TABLET | Freq: Every day | ORAL | Status: DC

## 2014-09-02 MED ORDER — COMPLETENATE 29-1 MG PO CHEW
1.0000 | CHEWABLE_TABLET | Freq: Every day | ORAL | Status: DC
  Administered 2014-09-02 – 2014-09-03 (×2): 1 via ORAL
  Filled 2014-09-02 (×3): qty 1

## 2014-09-02 MED ORDER — WITCH HAZEL-GLYCERIN EX PADS: 1.0000 "application " | MEDICATED_PAD | CUTANEOUS | Status: DC | PRN

## 2014-09-02 NOTE — Progress Notes (Signed)
Intrathecal catheter removed. Tip intact. Dr. Malen GauzeFoster to follow up tomorrow. Had a headache this am which has now resolved. Instructed on concerning signs/symptoms to call anesthesia for. Please don't hesitate to call if any questions or concerns.

## 2014-09-02 NOTE — Progress Notes (Signed)
CLINICAL SOCIAL WORK MATERNAL/CHILD NOTE  Patient Details  Name: Chelsea Small MRN: [id number] Date of Birth: 09/01/2012  Date:  09/02/2014  Clinical Social Worker Initiating Note:  Loleta BooksSarah Todd Jelinski, LCSW Date/ Time Initiated:  09/02/14/1000     Child's Name:  Chelsea Small   Legal Guardian:  Chelsea Small (mother) and Chelsea Small (father)  Need for Interpreter:  None   Date of Referral:  09/01/14     Reason for Referral:  Maternal history of anxiety   [facility]   Address:  [address] [place] [place], [place] [address]  Phone number:  [phone]   Household Members:  Parents   Natural Supports (not living in the home):  Spouse/significant other   Professional Supports: None   Financial Resources:  Environmental education officerGeneric commercial insurance  Other Resources[facility]    [facility]  Cultural/Religious Considerations Which May Impact Care: None reported  Strengths:  Ability to meet basic needs , Home prepared for child , Pediatrician chosen    Risk Factors/Current Problems:   1)Mental Health Concerns: MOB presents with history of anxiety, and endorsed racing thoughts and insomnia during the pregnancy as she focused on her worries and fears.  MOB discussed that this is baseline and not unique to the pregnancy. MOB presented with minimal interest in medication management for anxiety at this time.  Cognitive State:  Able to Concentrate , Alert , Goal Oriented    Mood/Affect:  Comfortable , Happy , Interested , Anxious    CSW Assessment:  CSW received request for consult due to MOB presenting with a history of anxiety. MOB presented in a pleasant mood and displayed a full range in affect. FOB was also present in the room, but he did not participate as he was attending to the infant.  MOB required time to establish rapport which lead to MOB becoming more open and forthcoming related to her mental health history and how she has been feeling during the pregnancy.  MOB did not  present with symptoms of anxiety, but she discussed anxieties, worries, and fears that she felt during the pregnancy and now as she transitions to the postpartum period.    MOB originally denied history of anxiety, and indicated lack of awareness of why anxiety has been noted and documented in her chart. Upon further exploration, she stated that she "always worries", and discussed that she often is nervous about "small stuff". MOB never clarified exact triggers for anxiety, or what occurs when she is anxious, but she discussed how she had frequent racing thoughts in the evenings that led to insomnia.  MOB shared that this is her baseline, and that she did not notice an increase in symptoms during the pregnancy.  CSW shared that these are the two most common symptoms of perinatal anxiety disorders.  MOB acknowledged statement, and agreed that she does have a lot of worries and anxieties related to parenting and caring for the infant. Per MOB, she is currently worried about breastfeeding. She stated [facility] and feels pressure since she will be feeding for him. CSW validated and normalized her feelings, and MOB shared that she currently feels as though feeding is going well. MOB expressed [facility] to reduce worries, fears, and to increase comfort prior to discharge.  CSW continued to discuss common treatment options for anxiety, and MOB need to treat symptoms at this time. She indicated limited interest in medications, and then reported that she  highly values indepedence and self-sufficiency. She shared that it is difficult for her to ask for help unless, "I really need it".  MOB recognized how ongoing anxiety may impact parenting and the transition to the postpartum period, and agreed to notify her medical provider if she notes that normative ways of coping are not effective in symptom control.   MOB denied additional questions, concerns, or needs at this  time. She stated that she has a support system that she can utilize if needed, and reported that the home is prepared for the infant. MOB expressed appreciation for the visit and agreed to contact CSW if additional needs arise during the admission.   CSW Plan/Description:   1)Patient/Family Education: Perinatal mood and anxiety disorders 2)MOB to notify her medical provider if she notes that anxiety symptoms intensify and normative ways of coping are ineffective.  3)No Further Intervention Required/No Barriers to Discharge    Kelby Fam 09/02/2014, 11:29 AM

## 2014-09-02 NOTE — Lactation Note (Signed)
This note was copied from the chart of Boy Gaston Islamymeisha Delpilar. Lactation Consultation Note  Patient Name: Boy Gaston Islamymeisha Currey NWGNF'AToday's Date: 09/02/2014 Reason for consult: Initial assessment Visited with Mom, baby 7313 hrs old.  [facility]  Fed baby 4 times already, latch scores 8 and 7.  Mom aware of importance of a wide, deep latch onto areola.  Encouraged skin to skin, and cue based feedings.  Baby sleeping in crib presently.  Asked about pumping.  Encouraged her to exclusively breast feed, waiting 4-6 weeks before pumping and giving baby a bottle or pacifier.  Encouraged her to call for help as needed.   Brochure left with her and explained about IP and OP lactation services available to her.  Follow up in am.  Judee ClaraSmith, Sharron Simpson E 09/02/2014, 11:25 AM

## 2014-09-02 NOTE — Addendum Note (Signed)
Addendum  created 09/02/14 1332 by Karie SchwalbeMary Lavern Crimi, MD   Modules edited: Clinical Notes   Clinical Notes:  File: [id number]

## 2014-09-02 NOTE — Anesthesia Postprocedure Evaluation (Signed)
  Anesthesia Post-op Note  Patient: Chelsea Small  Procedure(s) Performed: * No procedures listed *  Patient Location: PACU and Mother/Baby  Anesthesia Type:Epidural  Level of Consciousness: awake, alert  and oriented  Airway and Oxygen Therapy: Patient Spontanous Breathing  Post-op Pain: none  Post-op Assessment: Post-op Vital signs reviewed, Patient's Cardiovascular Status Stable, Respiratory Function Stable, Patent Airway, No signs of Nausea or vomiting, Pain level controlled, No headache and No backache              Post-op Vital Signs: Reviewed and stable  Last Vitals:  Filed Vitals:   09/02/14 0500  BP: 110/65  Pulse: 100  Temp: 37.1 C  Resp: 18    Complications: Wet tap occurred during placement of epidural catheter. She currently has no HA. Will reevaluate this pm.

## 2014-09-03 ENCOUNTER — Encounter (HOSPITAL_COMMUNITY): Payer: Self-pay | Admitting: Anesthesiology

## 2014-09-03 MED ORDER — FERROUS SULFATE 300 (60 FE) MG/5ML PO SYRP: 300.0000 mg | ORAL_SOLUTION | Freq: Three times a day (TID) | ORAL | Status: DC

## 2014-09-03 MED ORDER — IBUPROFEN 100 MG/5ML PO SUSP: 600.0000 mg | Freq: Four times a day (QID) | ORAL | Status: DC

## 2014-09-03 NOTE — Discharge Instructions (Signed)
Postpartum Care After Vaginal Delivery  After you deliver your newborn (postpartum period), the usual stay in the hospital is 24-72 hours. If there were problems with your labor or delivery, or if you have other medical problems, you might be in the hospital longer.   While you are in the hospital, you will receive help and instructions on how to care for yourself and your newborn during the postpartum period.   While you are in the hospital:  · Be sure to tell your nurses if you have pain or discomfort, as well as where you feel the pain and what makes the pain worse.  · If you had an incision made near your vagina (episiotomy) or if you had some tearing during delivery, the nurses may put ice packs on your episiotomy or tear. The ice packs may help to reduce the pain and swelling.  · If you are breastfeeding, you may feel uncomfortable contractions of your uterus for a couple of weeks. This is normal. The contractions help your uterus get back to normal size.  · It is normal to have some bleeding after delivery.  ¨ For the first 1-3 days after delivery, the flow is red and the amount may be similar to a period.  ¨ It is common for the flow to start and stop.  ¨ In the first few days, you may pass some small clots. Let your nurses know if you begin to pass large clots or your flow increases.  ¨ Do not  flush blood clots down the toilet before having the nurse look at them.  ¨ During the next 3-10 days after delivery, your flow should become more watery and pink or brown-tinged in color.  ¨ Ten to fourteen days after delivery, your flow should be a small amount of yellowish-white discharge.  ¨ The amount of your flow will decrease over the first few weeks after delivery. Your flow may stop in 6-8 weeks. Most women have had their flow stop by 12 weeks after delivery.  · You should change your sanitary pads frequently.  · Wash your hands thoroughly with soap and water for at least 20 seconds after changing pads, using  the toilet, or before holding or feeding your newborn.  · You should feel like you need to empty your bladder within the first 6-8 hours after delivery.  · In case you become weak, lightheaded, or faint, call your nurse before you get out of bed for the first time and before you take a shower for the first time.  · Within the first few days after delivery, your breasts may begin to feel tender and full. This is called engorgement. Breast tenderness usually goes away within 48-72 hours after engorgement occurs. You may also notice milk leaking from your breasts. If you are not breastfeeding, do not stimulate your breasts. Breast stimulation can make your breasts produce more milk.  · Spending as much time as possible with your newborn is very important. During this time, you and your newborn can feel close and get to know each other. Having your newborn stay in your room (rooming in) will help to strengthen the bond with your newborn.  It will give you time to get to know your newborn and become comfortable caring for your newborn.  · Your hormones change after delivery. Sometimes the hormone changes can temporarily cause you to feel sad or tearful. These feelings should not last more than a few days. If these feelings last longer than   that, you should talk to your caregiver.  · If desired, talk to your caregiver about methods of family planning or contraception.  · Talk to your caregiver about immunizations. Your caregiver may want you to have the following immunizations before leaving the hospital:  ¨ Tetanus, diphtheria, and pertussis (Tdap) or tetanus and diphtheria (Td) immunization. It is very important that you and your family (including grandparents) or others caring for your newborn are up-to-date with the Tdap or Td immunizations. The Tdap or Td immunization can help protect your newborn from getting ill.  ¨ Rubella immunization.  ¨ Varicella (chickenpox) immunization.  ¨ Influenza immunization. You should  receive this annual immunization if you did not receive the immunization during your pregnancy.  Document Released: 12/11/2006 Document Revised: 11/08/2011 Document Reviewed: 10/11/2011  ExitCare® Patient Information ©2015 [employer]. This information is not intended to replace advice given to you by your health care provider. Make sure you discuss any questions you have with your health care provider.  Postpartum Depression and Baby Blues  The postpartum period begins right after the birth of a baby. During this time, there is often a great amount of joy and excitement. It is also a time of many changes in the life of the parents. Regardless of how many times a mother gives birth, each child brings new challenges and dynamics to the family. It is not unusual to have feelings of excitement along with confusing shifts in moods, emotions, and thoughts. All mothers are at risk of developing postpartum depression or the "baby blues." These mood changes can occur right after giving birth, or they may occur many months after giving birth. The baby blues or postpartum depression can be mild or severe. Additionally, postpartum depression can go away rather quickly, or it can be a long-term condition.   CAUSES  Raised hormone levels and the rapid drop in those levels are thought to be a main cause of postpartum depression and the baby blues. A number of hormones change during and after pregnancy. Estrogen and progesterone usually decrease right after the delivery of your baby. The levels of thyroid hormone and various cortisol steroids also rapidly drop. Other factors that play a role in these mood changes include major life events and genetics.   RISK FACTORS  If you have any of the following risks for the baby blues or postpartum depression, know what symptoms to watch out for during the postpartum period. Risk factors that may increase the likelihood of getting the baby blues or postpartum depression include:  · Having  a personal or family history of depression.    · Having depression while being pregnant.    · Having premenstrual mood issues or mood issues related to oral contraceptives.  · Having a lot of life stress.    · Having marital conflict.    · Lacking a social support network.    · Having a baby with special needs.    · Having health problems, such as diabetes.    SIGNS AND SYMPTOMS  Symptoms of baby blues include:  · Brief changes in mood, such as going from extreme happiness to sadness.  · Decreased concentration.    · Difficulty sleeping.    · Crying spells, tearfulness.    · Irritability.    · Anxiety.    Symptoms of postpartum depression typically begin within the first month after giving birth. These symptoms include:  · Difficulty sleeping or excessive sleepiness.    · Marked weight loss.    · Agitation.    · Feelings of worthlessness.    · Lack   of interest in activity or food.    Postpartum psychosis is a very serious condition and can be dangerous. Fortunately, it is rare. Displaying any of the following symptoms is cause for immediate medical attention. Symptoms of postpartum psychosis include:   · Hallucinations and delusions.    · Bizarre or disorganized behavior.    · Confusion or disorientation.    DIAGNOSIS   A diagnosis is made by an evaluation of your symptoms. There are no medical or lab tests that lead to a diagnosis, but there are various questionnaires that a health care provider may use to identify those with the baby blues, postpartum depression, or psychosis. Often, a screening tool called the Edinburgh Postnatal Depression Scale is used to diagnose depression in the postpartum period.   TREATMENT  The baby blues usually goes away on its own in 1-2 weeks. Social support is often all that is needed. You will be encouraged to get adequate sleep and rest. Occasionally, you may be given medicines to help you sleep.   Postpartum depression requires treatment because it can last several months or  longer if it is not treated. Treatment may include individual or group therapy, medicine, or both to address any social, physiological, and psychological factors that may play a role in the depression. Regular exercise, a healthy diet, rest, and social support may also be strongly recommended.   Postpartum psychosis is more serious and needs treatment right away. Hospitalization is often needed.  HOME CARE INSTRUCTIONS  · Get as much rest as you can. Nap when the baby sleeps.    · Exercise regularly. Some women find yoga and walking to be beneficial.    · Eat a balanced and nourishing diet.    · Do little things that you enjoy. Have a cup of tea, take a bubble bath, read your favorite magazine, or listen to your favorite music.  · Avoid alcohol.    · Ask for help with household chores, cooking, grocery shopping, or running errands as needed. Do not try to do everything.    · Talk to people close to you about how you are feeling. Get support from your partner, family members, friends, or other new moms.  · Try to stay positive in how you think. Think about the things you are grateful for.    · Do not spend a lot of time alone.    · Only take over-the-counter or prescription medicine as directed by your health care provider.  · Keep all your postpartum appointments.    · Let your health care provider know if you have any concerns.    SEEK MEDICAL CARE IF:  You are having a reaction to or problems with your medicine.  SEEK IMMEDIATE MEDICAL CARE IF:  · You have suicidal feelings.    · You think you may harm the baby or someone else.  MAKE SURE YOU:  · Understand these instructions.  · Will watch your condition.  · Will get help right away if you are not doing well or get worse.  Document Released: 11/18/2003 Document Revised: 02/18/2013 Document Reviewed: 11/25/2012  ExitCare® Patient Information ©2015 [employer]. This information is not intended to replace advice given to you by your health care provider. Make sure  you discuss any questions you have with your health care provider.

## 2014-09-03 NOTE — Progress Notes (Signed)
Patient has been headache free until now. Head(frontal) =7 and pounding.States"maybe I got up to quickly after taking a nap just now". Motrin 600 in liquid form given , encouraged to feed baby side lying so head can be down. Encouraged po fluids and to call for continued headache pain.Report to Marda StalkerKristen Lassiter RN who will follow up with pain assessments.

## 2014-09-03 NOTE — Anesthesia Preprocedure Evaluation (Deleted)
Anesthesia Evaluation  Patient identified by MRN, date of birth, ID band Patient awake    Reviewed: Allergy & Precautions, Patient's Chart, lab work & pertinent test results  Airway Mallampati: III  TM Distance: >3 FB Neck ROM: Full    Dental   Pulmonary neg pulmonary ROS,          Cardiovascular negative cardio ROS Normal cardiovascular examRhythm:Regular Rate:Normal     Neuro/Psych Anxiety negative neurological ROS     GI/Hepatic Neg liver ROS, GERD-  Medicated and Controlled,  Endo/Other  negative endocrine ROS  Renal/GU negative Renal ROS  negative genitourinary   Musculoskeletal negative musculoskeletal ROS (+)   Abdominal   Peds  Hematology  (+) anemia ,   Anesthesia Other Findings   Reproductive/Obstetrics (+) Pregnancy                             Anesthesia Physical Anesthesia Plan Anesthesia Quick Evaluation

## 2014-09-03 NOTE — Addendum Note (Signed)
Addendum  created 09/03/14 0836 by Mal AmabileMichael Delpha Perko, MD   Modules edited: Clinical Notes   Clinical Notes:  File: [id number]

## 2014-09-03 NOTE — Discharge Summary (Signed)
Obstetric Discharge Summary Reason for Admission: onset of labor Prenatal Procedures: ultrasound Intrapartum Procedures: spontaneous vaginal delivery Postpartum Procedures: none Complications-Operative and Postpartum: 1st degree perineal laceration HEMOGLOBIN  Date Value Ref Range Status  09/02/2014 6.8* 12.0 - 15.0 g/dL Final    Comment:    REPEATED TO VERIFY CRITICAL RESULT CALLED TO, READ BACK BY AND VERIFIED WITH: MATTHEWS,S. @0652  09/02/2014 BY BOVELL,A.    HCT  Date Value Ref Range Status  09/02/2014 22.0* 36.0 - 46.0 % Final    Physical Exam:  General: alert, cooperative and no distress Lochia: appropriate Uterine Fundus: firm Incision: n/a DVT Evaluation: No evidence of DVT seen on physical exam.  Discharge Diagnoses: Term Pregnancy-delivered  Discharge Information: Date: 09/03/2014 Activity: pelvic rest Diet: routine Medications: PNV and Ibuprofen Condition: stable Instructions: See discharge instructions. Discharge to: home Follow-up Information    Follow up with Geryl RankinsVARNADO, Jamiracle Avants, MD. Schedule an appointment as soon as possible for a visit in 2 weeks.   Specialty:  Obstetrics and Gynecology   Why:  Baby blues check   Contact information:   [address] [place] [address] [phone]       Newborn Data: Live born female  Birth Weight: 7 lb 10.1 oz (3460 g) APGAR: 8, 9  Home with mother. Pediatrician notified of Chronic Chlamydia. Nidia Grogan 09/03/2014, 9:08 AM

## 2014-09-03 NOTE — Progress Notes (Signed)
Patient has been ambulating. No complaints of HA. Has some minor back discomfort. Scheduled to be discharged today. Advised to go to [facility] if she develops a severe HA or call anesthesiologist on call.

## 2014-09-03 NOTE — Lactation Note (Signed)
This note was copied from the chart of Chelsea Small. Lactation Consultation Note  Patient Name: Chelsea Gaston Islamymeisha Oviedo WUJWJ'XToday's Date: 09/03/2014 Reason for consult: Follow-up assessment Baby just came off the breast when LC arrived. Baby asleep and satiated. Basic teaching reviewed with Mom. Advised baby should be at the breast 8-12 times in 24 hours and with feeding ques. Encouraged to keep baby nursing for 15-20 minutes, both breasts when possible. Cluster feeding reviewed with Mom. Encouraged to call for questions/concerns or assist as needed.   Maternal Data    Feeding Feeding Type: Breast Fed Length of feed: 20 min  LATCH Score/Interventions Latch: Grasps breast easily, tongue down, lips flanged, rhythmical sucking. Intervention(s): Assist with latch  Audible Swallowing: Spontaneous and intermittent  Type of Nipple: Everted at rest and after stimulation  Comfort (Breast/Nipple): Soft / non-tender     Hold (Positioning): Assistance needed to correctly position infant at breast and maintain latch.  LATCH Score: 9  Lactation Tools Discussed/Used     Consult Status Consult Status: Follow-up Date: 09/04/14 Follow-up type: In-patient    Alfred LevinsGranger, Tamer Baughman Ann 09/03/2014, 1:43 PM

## 2014-09-03 NOTE — Lactation Note (Signed)
This note was copied from the chart of Chelsea Small Felten. Lactation Consultation Note Mom using DEBP for stimulation of long pendulum breast. Everted nipples easily expressed colostrum. Mom wondering why she wasn't getting anything during pumping. Explained thick colostrum, mom states she understands. Encouraged hand expression if wanted to give baby colostrum for supplementing. Told mom how to clean pump gave soap and basin. Mom knows to pump q3h for 15-20 min. Patient Name: Chelsea Small Soth ZOXWR'UToday's Date: 09/03/2014 Reason for consult: Follow-up assessment   Maternal Data    Feeding Feeding Type: Breast Fed Length of feed: 16 min  LATCH Score/Interventions       Type of Nipple: Everted at rest and after stimulation  Comfort (Breast/Nipple): Soft / non-tender     Intervention(s): Breastfeeding basics reviewed;Support Pillows;Position options;Skin to skin     Lactation Tools Discussed/Used Tools: Pump Breast pump type: Double-Electric Breast Pump Pump Review: Setup, frequency, and cleaning;Milk Storage Initiated by:: RN Date initiated:: 09/03/14   Consult Status Consult Status: Follow-up Date: 09/04/14 Follow-up type: In-patient    Pragya Lofaso, Diamond NickelLAURA G 09/03/2014, 4:20 AM

## 2014-10-15 LAB — ABO/RH: ABO/RH(D): O POS

## 2015-10-12 IMAGING — US US OB COMP LESS 14 WK
1 series · 14 of 28 positions shown · non-contrast
Comparison: None.

CLINICAL DATA: Pregnant, hyperemesis

EXAM:
OBSTETRIC <14 WK ULTRASOUND
TECHNIQUE: Transabdominal ultrasound was performed for evaluation of the
gestation as well as the maternal uterus and adnexal regions.

[Series 1: us ob comp less 14 wk · 0.19mm/px · 58 acquisitions, 14 frames shown]
[im 3/58]
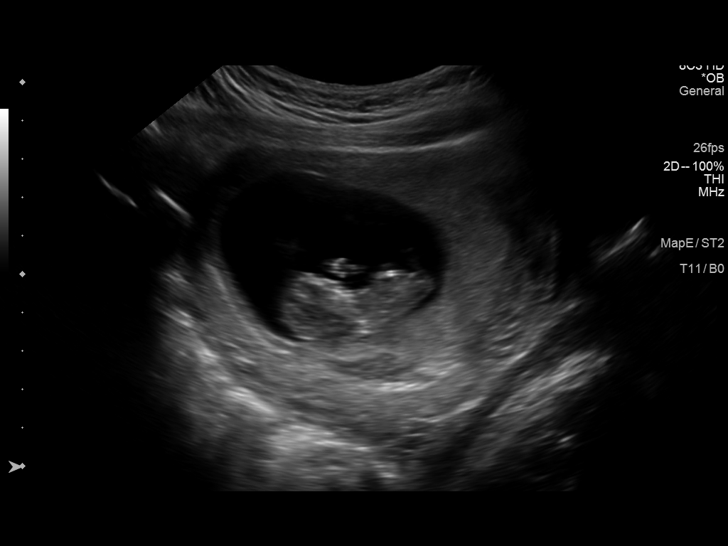
[im 7/58]
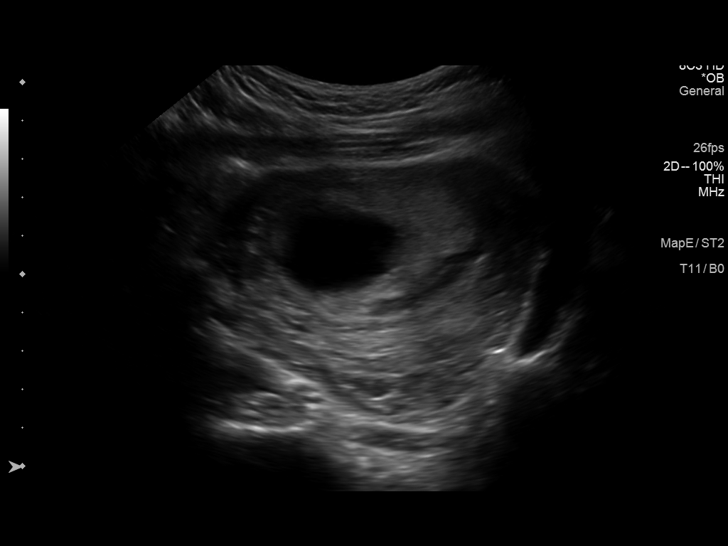
[im 11/58]
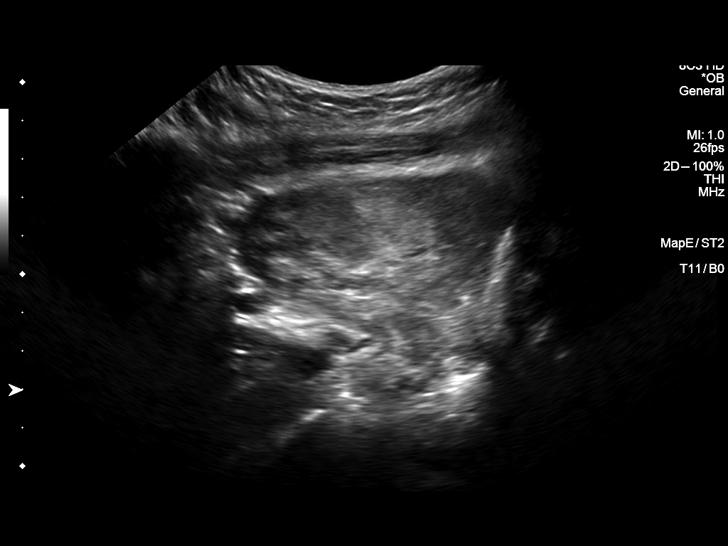
[im 15/58]
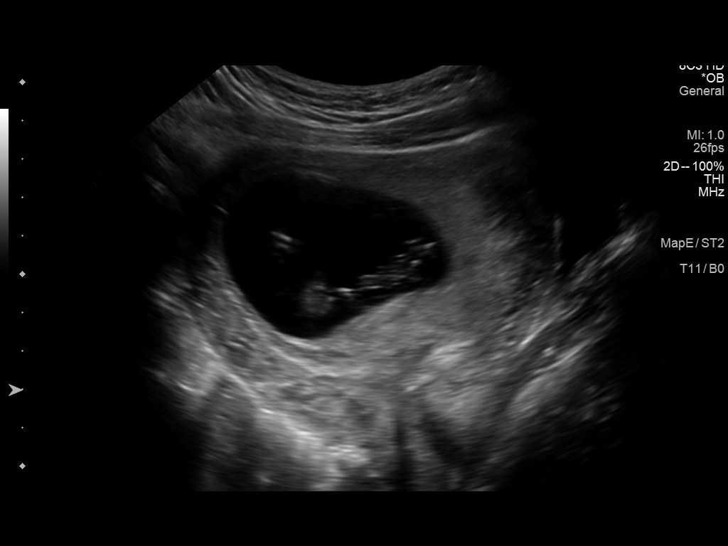
[im 20/58]
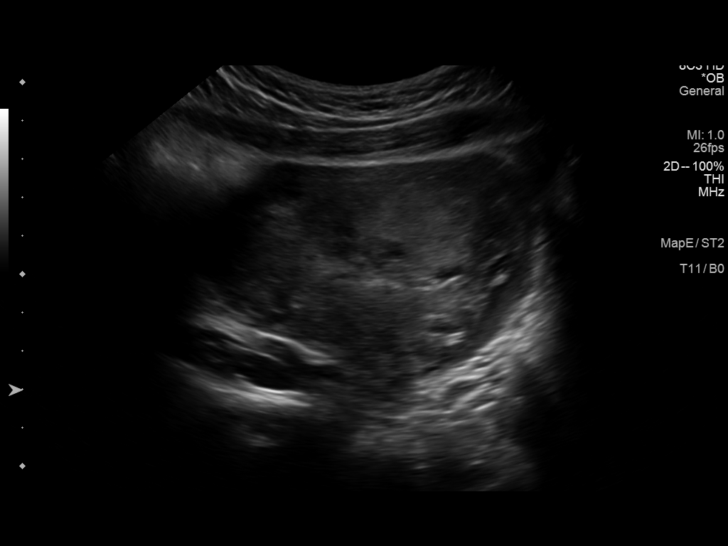
[im 24/58]
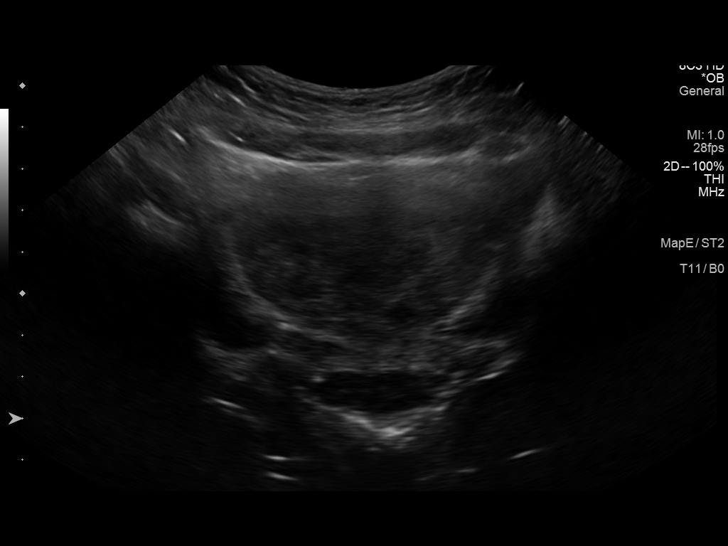
[im 28/58]
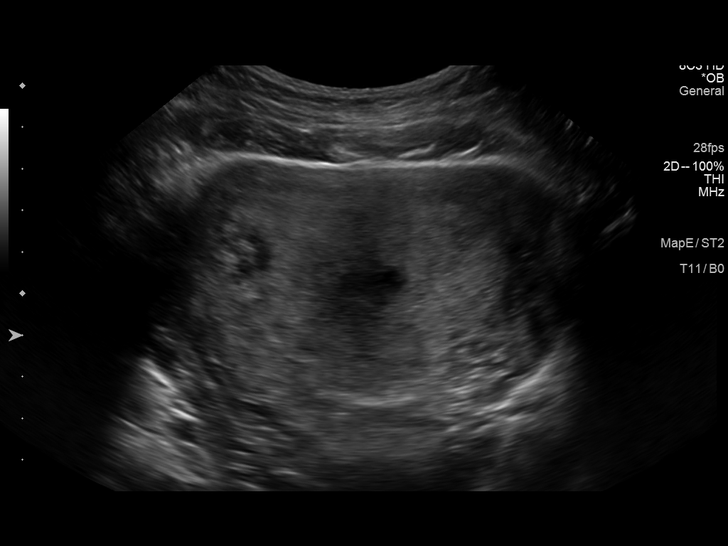
[im 32/58]
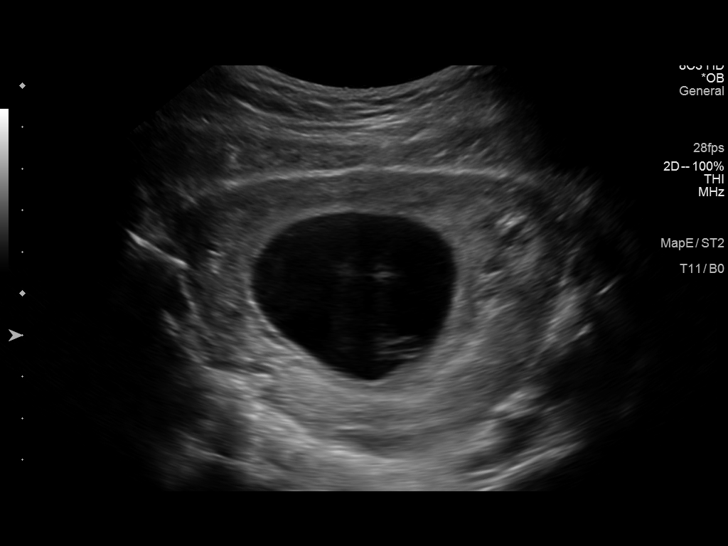
[im 36/58]
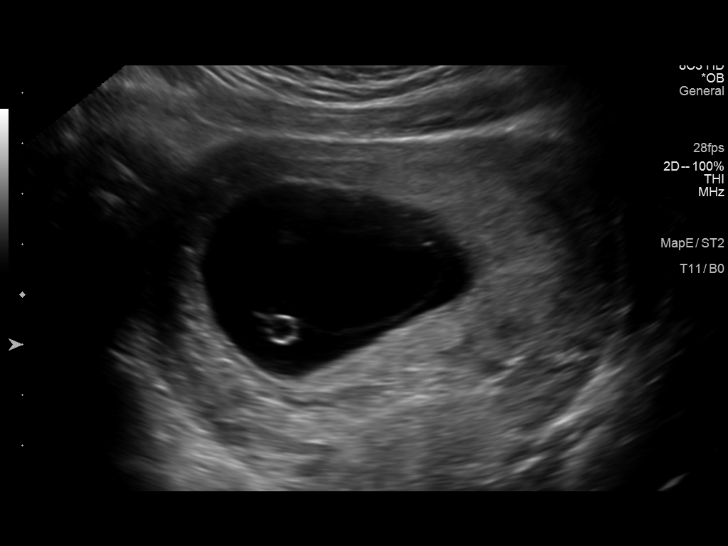
[im 41/58]
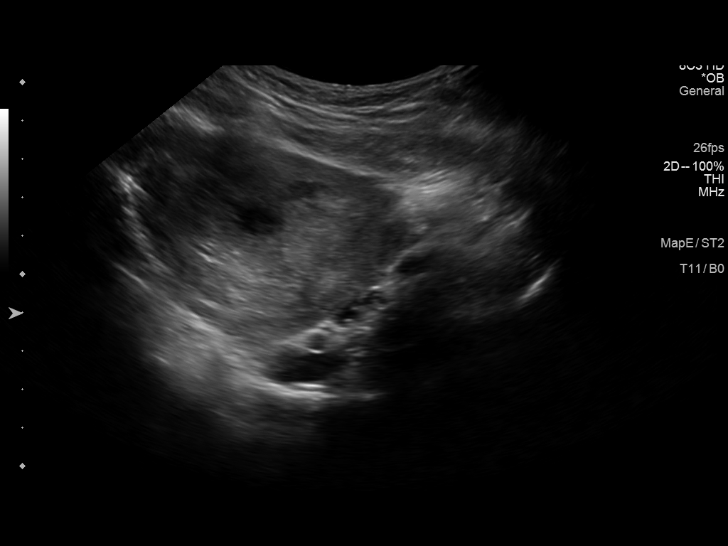
[im 45/58]
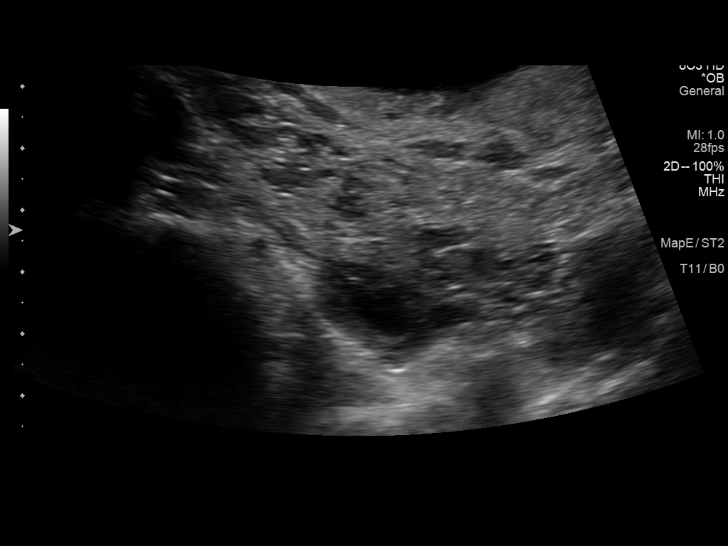
[im 49/58]
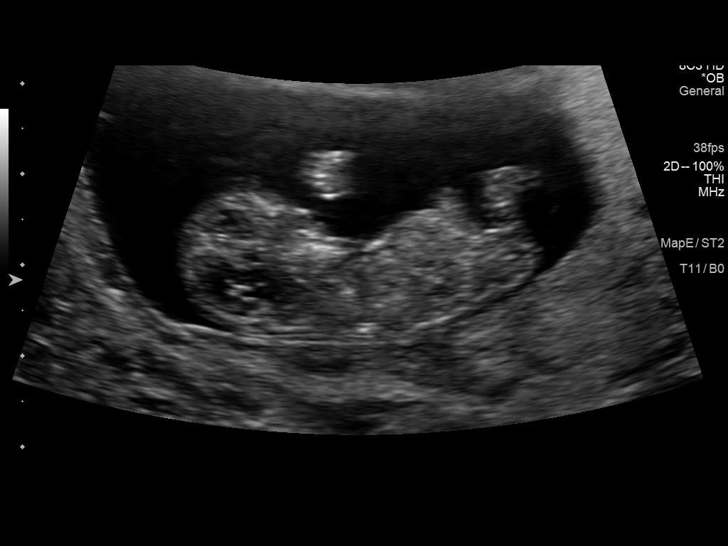
[im 53/58]
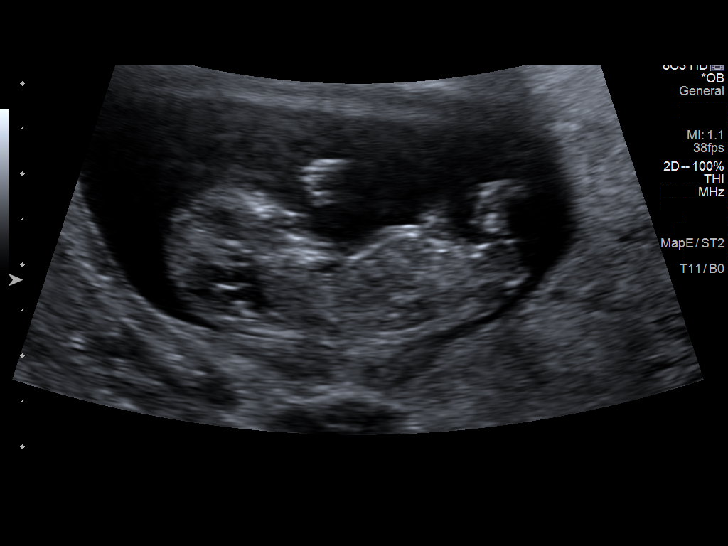
[im 58/58]
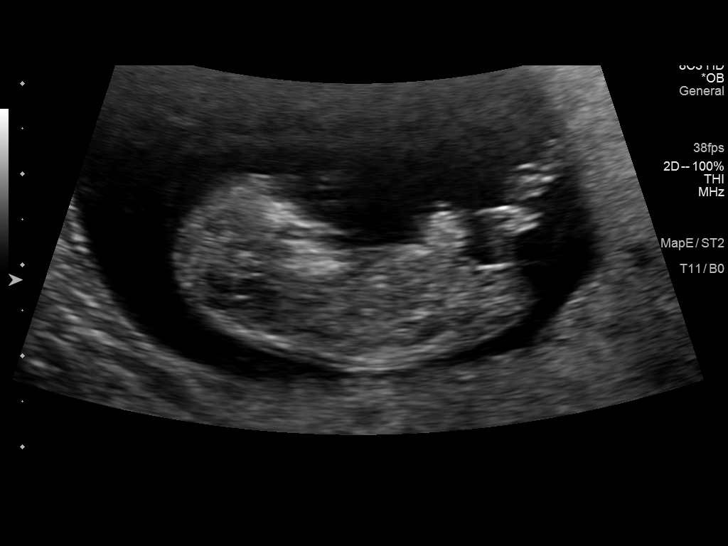

[14 of 28 positions shown; findings below may reference images not displayed]

FINDINGS: Intrauterine gestational sac: Visualized/normal in shape.

Yolk sac:  Present

Embryo:  Present

Cardiac Activity: Present

Heart Rate: 173 bpm

CRL:   39.7  mm   10 w 6 d                  US EDC: 09/03/2014

Maternal uterus/adnexae: Small subchorionic hemorrhage.

Bilateral ovaries are within normal limits.

No free fluid.
IMPRESSION: Single live intrauterine gestation with estimated gestational age 10
weeks 6 days by crown-rump length.

## 2017-08-20 ENCOUNTER — Emergency Department: Payer: 59

## 2017-08-20 ENCOUNTER — Emergency Department
Admission: EM | Admit: 2017-08-20 | Discharge: 2017-08-20 | Disposition: A | Discharge status: Home / Self Care | Payer: 59 | Attending: Emergency Medicine | Admitting: Emergency Medicine

## 2017-08-20 ENCOUNTER — Other Ambulatory Visit: Payer: Self-pay

## 2017-08-20 ENCOUNTER — Encounter: Payer: Self-pay | Admitting: Emergency Medicine

## 2017-08-20 DIAGNOSIS — R079 Chest pain, unspecified: Secondary | ICD-10-CM

## 2017-08-20 DIAGNOSIS — F419 Anxiety disorder, unspecified: Secondary | ICD-10-CM | POA: Diagnosis not present

## 2017-08-20 LAB — CBC
HEMATOCRIT: 39.3 % (ref 35.0–47.0)
HEMOGLOBIN: 13.5 g/dL (ref 12.0–16.0)
MCH: 30.1 pg (ref 26.0–34.0)
MCHC: 34.3 g/dL (ref 32.0–36.0)
MCV: 87.9 fL (ref 80.0–100.0)
PLATELETS: 282 10*3/uL (ref 150–440)
RBC: 4.47 MIL/uL (ref 3.80–5.20)
RDW: 14.2 % (ref 11.5–14.5)
WBC: 6.3 10*3/uL (ref 3.6–11.0)

## 2017-08-20 LAB — BASIC METABOLIC PANEL
ANION GAP: 8 (ref 5–15)
BUN: 10 mg/dL (ref 6–20)
CO2: 24 mmol/L (ref 22–32)
Calcium: 9.3 mg/dL (ref 8.9–10.3)
Chloride: 105 mmol/L (ref 101–111)
Creatinine, Ser: 0.8 mg/dL (ref 0.44–1.00)
GFR calc Af Amer: >60 mL/min (ref >60)
GFR calc non Af Amer: >60 mL/min (ref >60)
GLUCOSE: 89 mg/dL (ref 65–99)
POTASSIUM: 3.7 mmol/L (ref 3.5–5.1)
Sodium: 137 mmol/L (ref 135–145)

## 2017-08-20 LAB — TROPONIN I: Troponin I: <0.03 ng/mL (ref <0.03)

## 2017-08-20 LAB — FIBRIN DERIVATIVES D-DIMER (ARMC ONLY): Fibrin derivatives D-dimer (ARMC): 520.37 ng/mL (FEU) — ABNORMAL HIGH (ref 0.00–499.00)

## 2017-08-20 LAB — POCT PREGNANCY, URINE: Preg Test, Ur: NEGATIVE

## 2017-08-20 MED ORDER — IOPAMIDOL (ISOVUE-370) INJECTION 76%
75.0000 mL | Freq: Once | INTRAVENOUS | Status: AC | PRN
  Administered 2017-08-20: 75 mL via INTRAVENOUS
  Filled 2017-08-20: qty 75

## 2017-08-20 NOTE — Discharge Instructions (Addendum)
Make a follow-up appoint with your primary care physician to discuss your chest pain, as well as your severe anxiety related to cats and birds, and the impact of stress in your life.  Return to the emergency department if you develop severe pain, lightheadedness or fainting, palpitations, shortness of breath, or any other symptoms concerning to you.

## 2017-08-20 NOTE — ED Notes (Signed)
Pt verbalizes understanding of discharge instructions.

## 2017-08-20 NOTE — ED Notes (Signed)
Lav and Green top tube have been drawn and sent to lab.

## 2017-08-20 NOTE — ED Provider Notes (Signed)
[facility] Emergency Department Provider Note  ____________________________________________  Time seen: Approximately 3:35 PM  I have reviewed the triage vital signs and the nursing notes.   HISTORY  Chief Complaint Chest Pain    HPI Chelsea Small is a 27 y.o. female with a history of anxiety not currently on psychiatric medication, on Norplant, presenting with chest pain.  The patient reports that for the last 3 weeks, she has had a central chest "pressure" that lasts approximately 1.5 hours and resolves on its own.  It is occasionally associated with shortness of breath and nausea without vomiting.  She has not had any palpitations, lightheadedness or syncope.  The pain is nonpleuritic.  The episodes mostly occur when she is "scared."  She reports that she has a severe fear of birds and cats, and that the pain is triggered by this.  She also has more pain in situations of stress.  No association with food.  The patient reports that she feels better if she sits down or lays down but it does not go completely away.  Sometimes it also improves when she takes her bra off.  She has not noted any lower extremity swelling or calf pain.  FH: Grandmother with MI in her 70s; grandmother with PE in the setting of lung cancer  SH: Denies smoking or cocaine.  Past Medical History:  Diagnosis Date  . Anxiety    no meds  . Chlamydia   . Eczema   . Infection    UTI    Patient Active Problem List   Diagnosis Date Noted  . Active labor at term 09/01/2014  . Abdominal pain, epigastric 02/18/2014  . Elevated amylase and lipase 02/18/2014  . Hyperemesis gravidarum 02/12/2014  . Nausea and vomiting 02/12/2014  . N&V (nausea and vomiting) 02/11/2014    Past Surgical History:  Procedure Laterality Date  . NO PAST SURGERIES      Current Outpatient Rx  . Order #: [id number] Class: Normal  . Order #: [id number] Class: Normal  . Order #: [id number] Class: Normal  . Order  #: [id number] Class: Historical Med    Allergies Patient has no known allergies.  Family History  Problem Relation Age of Onset  . Hypertension Father   . Stroke Maternal Grandfather   . Cancer Paternal Grandmother        lung  . Hearing loss Paternal Grandfather        with age    Social History Social History   Tobacco Use  . Smoking status: Never Smoker  . Smokeless tobacco: Never Used  Substance Use Topics  . Alcohol use: No  . Drug use: No    Review of Systems Constitutional: No fever/chills.  No lightheadedness or syncope.  Eyes: No visual changes. ENT: No sore throat. No congestion or rhinorrhea. Cardiovascular: Positive central chest pressure. Denies palpitations. Respiratory: Denies shortness of breath.  No cough. Gastrointestinal: No abdominal pain.  Positive nausea, no vomiting.  No diarrhea.  No constipation. Genitourinary: Negative for dysuria. Musculoskeletal: Negative for back pain. Skin: Negative for rash. Neurological: Negative for headaches. No focal numbness, tingling or weakness.     ____________________________________________   PHYSICAL EXAM:  VITAL SIGNS: ED Triage Vitals  Enc Vitals Group     BP 08/20/17 1330 124/76     Pulse Rate 08/20/17 1330 (!) 103     Resp 08/20/17 1330 18     Temp 08/20/17 1330 98.8 F (37.1 C)     Temp Source 08/20/17 1330 Oral  SpO2 08/20/17 1330 99 %     Weight 08/20/17 1331 140 lb (63.5 kg)     Height 08/20/17 1331 5\' 1"  (1.549 m)     Head Circumference --      Peak Flow --      Pain Score 08/20/17 1342 0     Pain Loc --      Pain Edu? --      Excl. in GC? --     Constitutional: Alert and oriented. Answers questions appropriately. Eyes: Conjunctivae are normal.  EOMI. No scleral icterus. Head: Atraumatic. Nose: No congestion/rhinnorhea. Mouth/Throat: Mucous membranes are moist.  Neck: No stridor.  Supple.  No JVD.  No meningismus. Cardiovascular: Normal rate, regular rhythm. No murmurs, rubs or  gallops.  Respiratory: Normal respiratory effort.  No accessory muscle use or retractions. Lungs CTAB.  No wheezes, rales or ronchi. Gastrointestinal: Soft, nontender and nondistended.  No guarding or rebound.  No peritoneal signs. Musculoskeletal: No LE edema. No ttp in the calves or palpable cords.  Negative Homan's sign. Neurologic:  A&Ox3.  Speech is clear.  Face and smile are symmetric.  EOMI.  Moves all extremities well. Skin:  Skin is warm, dry and intact. No rash noted. Psychiatric: Mood and affect are normal. Speech and behavior are normal.  Normal judgement.  ____________________________________________   LABS (all labs ordered are listed, but only abnormal results are displayed)  Labs Reviewed  FIBRIN DERIVATIVES D-DIMER ([facility] ONLY) - Abnormal; Notable for the following components:      Result Value   Fibrin derivatives D-dimer ([facility]) 520.37 (*)    All other components within normal limits  BASIC METABOLIC PANEL  CBC  TROPONIN I  POC URINE PREG, ED  POCT PREGNANCY, URINE   ____________________________________________  EKG  ----------------------------------------- ED ECG REPORT I, Rockne Menghini, the attending physician, personally viewed and interpreted this ECG.   Date: 08/20/2017  EKG Time: 1325  Rate: 112  Rhythm: sinus tachycardia  Axis: nromal  Intervals:none  ST&T Change: No STEMI; no evidence of Brugada syndrome, prolonged QTC, or hypertrophy.     ____________________________________________  RADIOLOGY  Dg Chest 2 View  Result Date: 08/20/2017 CLINICAL DATA:  Exertional shortness of breath with intermittent chest pressure since yesterday. Nonsmoker. EXAM: CHEST - 2 VIEW COMPARISON:  None in PACs FINDINGS: The lungs are well-expanded and clear. The heart and pulmonary vascularity are normal. The mediastinum is normal in width. The trachea is midline. There is no pleural effusion. The bony thorax exhibits no acute abnormality. IMPRESSION: There  is no pneumonia, CHF, or other acute cardiopulmonary abnormality. Electronically Signed   By: David  [place] M.D.   On: 08/20/2017 14:06   Ct Angio Chest Pe W And/or Wo Contrast  Result Date: 08/20/2017 CLINICAL DATA:  Chest pain and short of breath for 2 weeks. EXAM: CT ANGIOGRAPHY CHEST WITH CONTRAST TECHNIQUE: Multidetector CT imaging of the chest was performed using the standard protocol during bolus administration of intravenous contrast. Multiplanar CT image reconstructions and MIPs were obtained to evaluate the vascular anatomy. CONTRAST:  75mL ISOVUE-370 IOPAMIDOL (ISOVUE-370) INJECTION 76% COMPARISON:  None. FINDINGS: Cardiovascular: There are no filling defects in the pulmonary arterial tree to suggest acute pulmonary thromboembolism. Mediastinum/Nodes: No abnormal mediastinal adenopathy or pericardial effusion Lungs/Pleura: Clear lungs.  No pneumothorax.  No pleural effusion. Upper Abdomen: No acute abnormality. Musculoskeletal: No acute bony deformity. Review of the MIP images confirms the above findings. IMPRESSION: No evidence of acute pulmonary thromboembolism. Electronically Signed   By: Jolaine Click  M.D.   On: 08/20/2017 18:03    ____________________________________________   PROCEDURES  Procedure(s) performed: None  Procedures  Critical Care performed: No ____________________________________________   INITIAL IMPRESSION / ASSESSMENT AND PLAN / ED COURSE  Pertinent labs & imaging results that were available during my care of the patient were reviewed by me and considered in my medical decision making (see chart for details).  27 y.o. female, otherwise healthy, resenting with 3 weeks of intermittent chest pain episodes.  Overall, the patient is hemodynamically stable.  Her EKG shows some sinus tachycardia, but her heart rate has normalized at this time.  I do not see any evidence of unstable arrhythmia, Brugada syndrome, prolonged QTC or hypertrophy.  Chest x-ray does not show  any acute cardiopulmonary process.  Her laboratory studies are reassuring with normal blood counts, normal electrolytes, and a negative troponin.  ACS or MI would be very unlikely in a 27 year old otherwise healthy patient.  She has no evidence of DVT, hypoxia, or pleuritic chest pain, but will get a d-dimer and follow with a CT angiogram if this is necessary.  The patient's history is not consistent with a GI cause including esophageal spasm or reflux, ulcer disease.  Pregnancy test is pending at this time.  Overall, the patient's symptoms are most consistent with anxiety or panic attacks that are related to stress, as well as triggers of birds and cats.    If the patient's work-up is reassuring, plan to discharge her home [place], including evaluation of her anxiety and stress.  Plan reevaluation for final disposition.  ----------------------------------------- 7:17 PM on 08/20/2017 -----------------------------------------  The patient CT scan does not show any PE.  Overall, the patient has a reassuring work-up in the emergency department does not require admission today.  She is stable for discharge home and follow-up instructions as well as return precautions have been discussed.  ____________________________________________  FINAL CLINICAL IMPRESSION(S) / ED DIAGNOSES  Final diagnoses:  Chest pain, unspecified type  Anxiety         NEW MEDICATIONS STARTED DURING THIS VISIT:  New Prescriptions   No medications on file      Rockne MenghiniNorman, Anne-Caroline, MD 08/20/17 [phone]

## 2017-08-20 NOTE — ED Triage Notes (Signed)
C/O intermittent chest pain and sob x 2 weeks.  Denies current complaint.  AAOx3.  Skin warm and dry.  NAD

## 2018-10-07 ENCOUNTER — Other Ambulatory Visit: Payer: Self-pay

## 2018-10-07 DIAGNOSIS — Z20822 Contact with and (suspected) exposure to covid-19: Secondary | ICD-10-CM

## 2018-10-08 LAB — NOVEL CORONAVIRUS, NAA: SARS-CoV-2, NAA: NOT DETECTED

## 2019-03-11 DIAGNOSIS — N930 Postcoital and contact bleeding: Secondary | ICD-10-CM | POA: Diagnosis not present

## 2019-03-11 DIAGNOSIS — Z124 Encounter for screening for malignant neoplasm of cervix: Secondary | ICD-10-CM | POA: Diagnosis not present

## 2019-04-14 DIAGNOSIS — Z113 Encounter for screening for infections with a predominantly sexual mode of transmission: Secondary | ICD-10-CM | POA: Diagnosis not present

## 2019-04-14 DIAGNOSIS — Z202 Contact with and (suspected) exposure to infections with a predominantly sexual mode of transmission: Secondary | ICD-10-CM | POA: Diagnosis not present

## 2019-04-14 DIAGNOSIS — N898 Other specified noninflammatory disorders of vagina: Secondary | ICD-10-CM | POA: Diagnosis not present

## 2019-04-20 IMAGING — CT CT ANGIO CHEST
2 of 6 series · 19 of 36 positions shown · IV contrast (APPLIED)
Comparison: None.

CLINICAL DATA: Chest pain and short of breath for 2 weeks.

EXAM:
CT ANGIOGRAPHY CHEST WITH CONTRAST
TECHNIQUE: Multidetector CT imaging of the chest was performed using the
standard protocol during bolus administration of intravenous
contrast. Multiplanar CT image reconstructions and MIPs were
obtained to evaluate the vascular anatomy.
CONTRAST:  75mL R0Q97T-GAH IOPAMIDOL (R0Q97T-GAH) INJECTION 76%

[Series 5: thins · axial · 0.50mm/px · z∈[+101,+309]mm · 18 of 232 slices shown]
[im 12/232  lung]
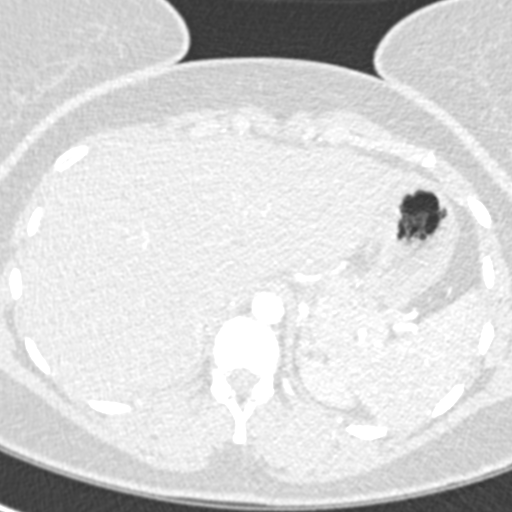
[im 24/232  mediastinal]
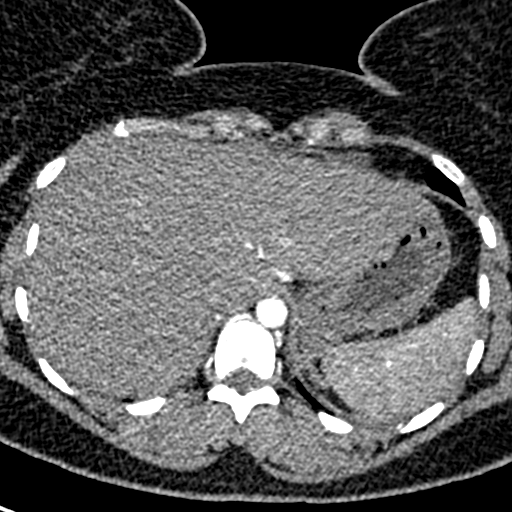
[im 35/232  lung]
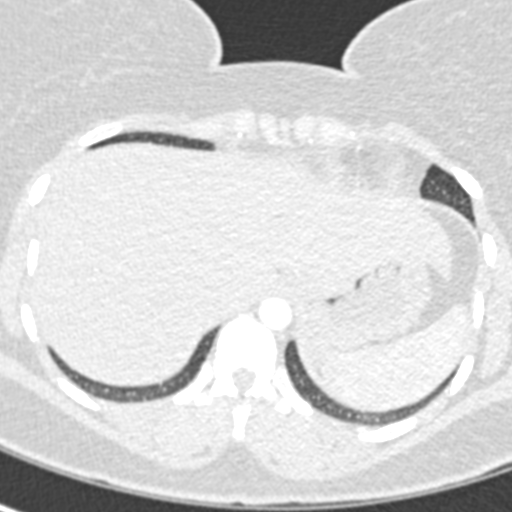
[im 47/232  mediastinal]
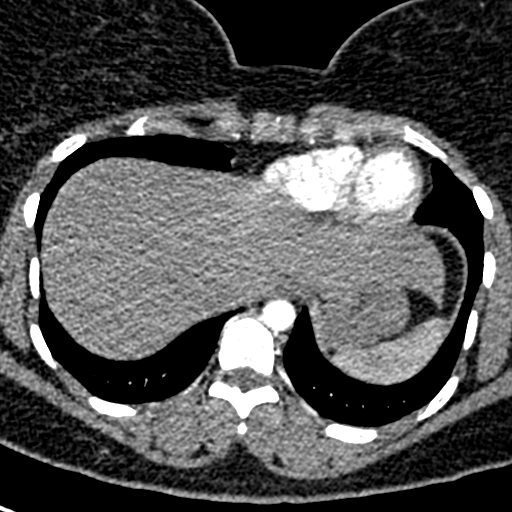
[im 58/232  lung]
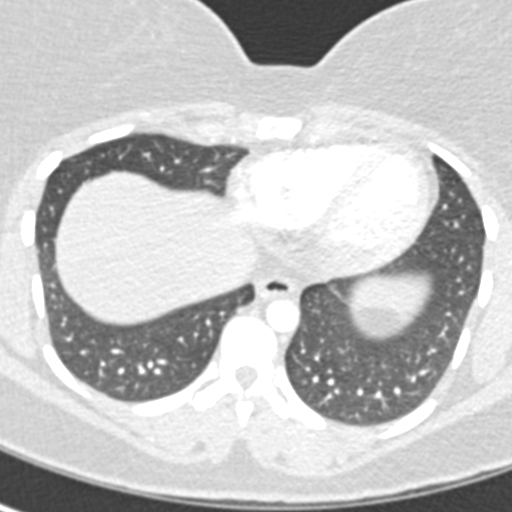
[im 70/232  mediastinal]
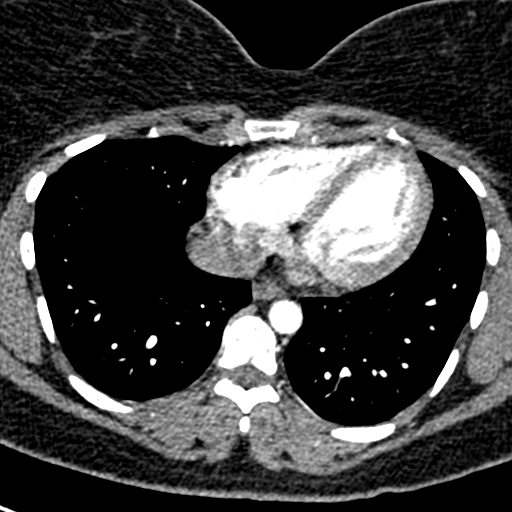
[im 81/232  lung]
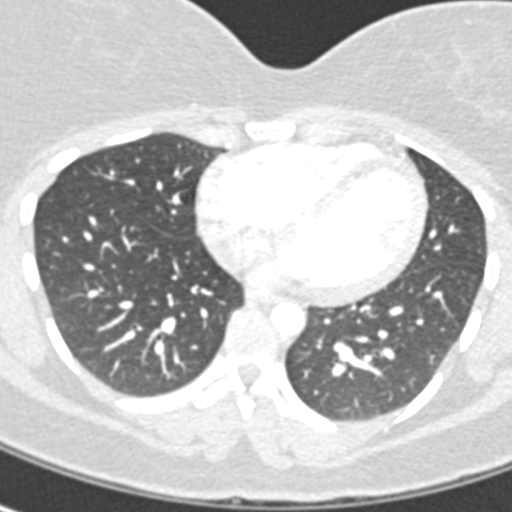
[im 93/232  mediastinal]
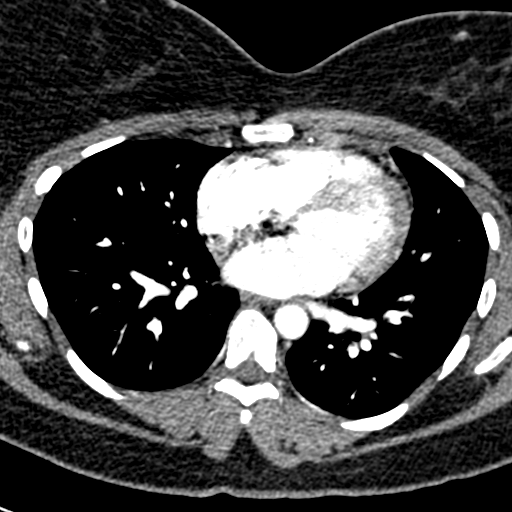
[im 104/232  lung]
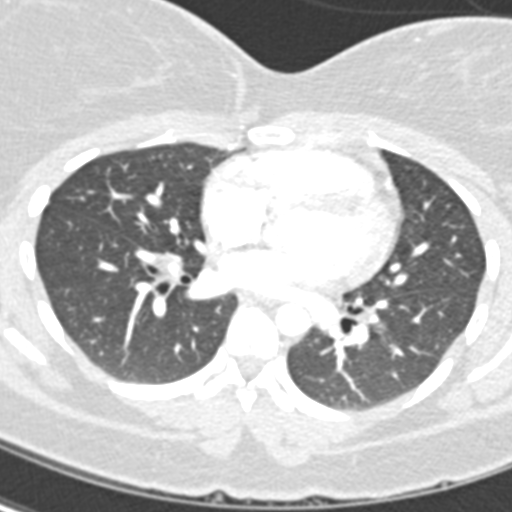
[im 128/232  mediastinal]
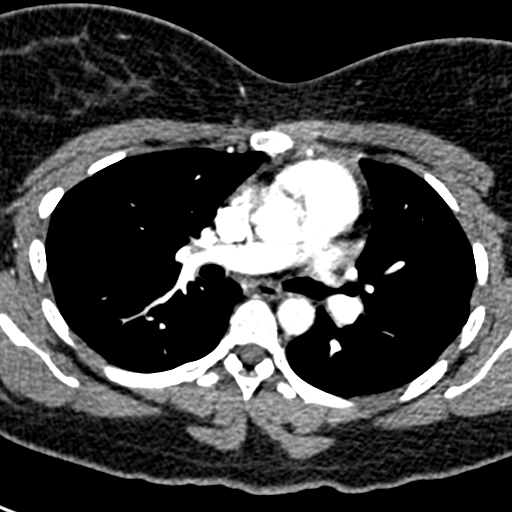
[im 139/232  lung]
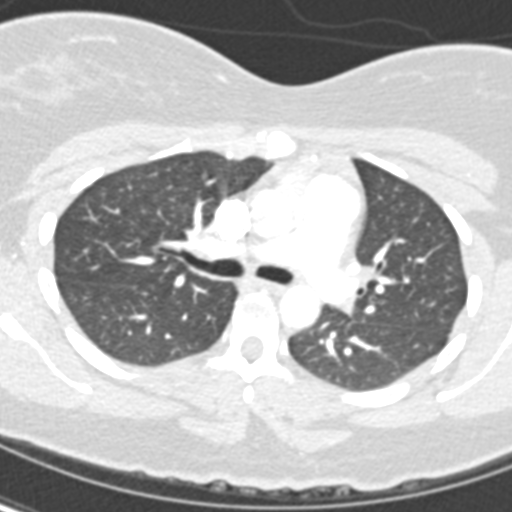
[im 151/232  mediastinal]
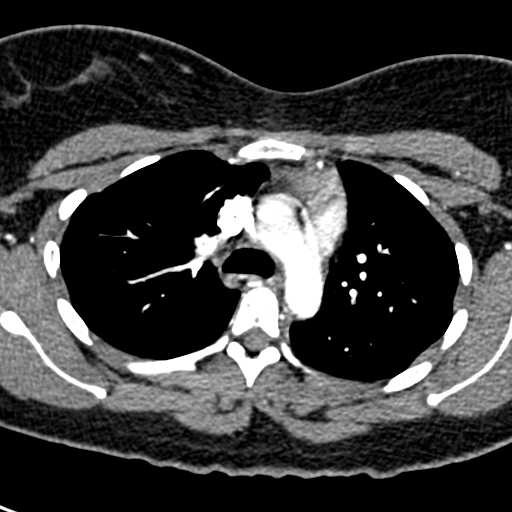
[im 162/232  lung]
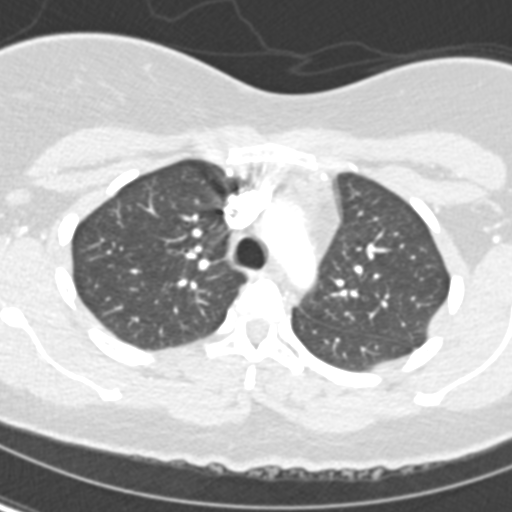
[im 174/232  mediastinal]
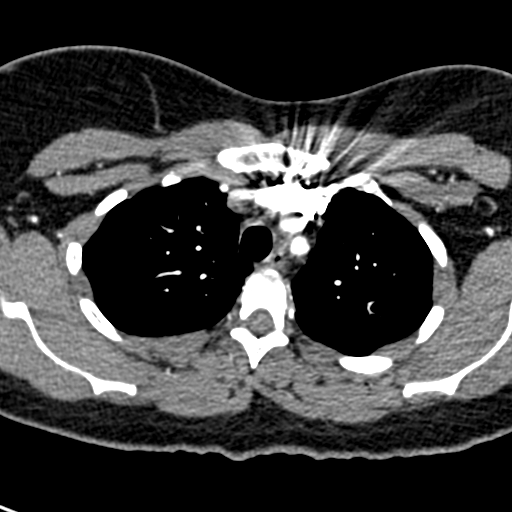
[im 185/232  lung]
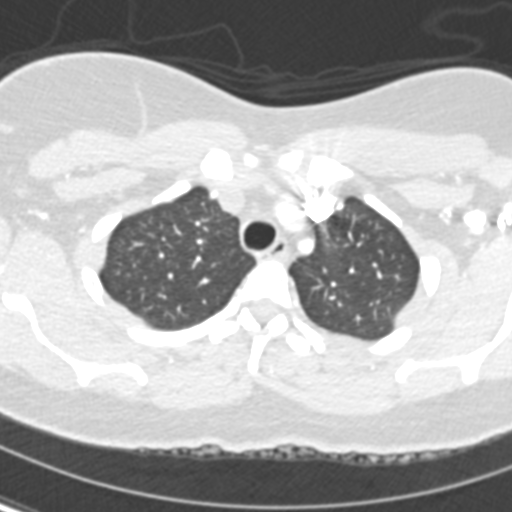
[im 197/232  mediastinal]
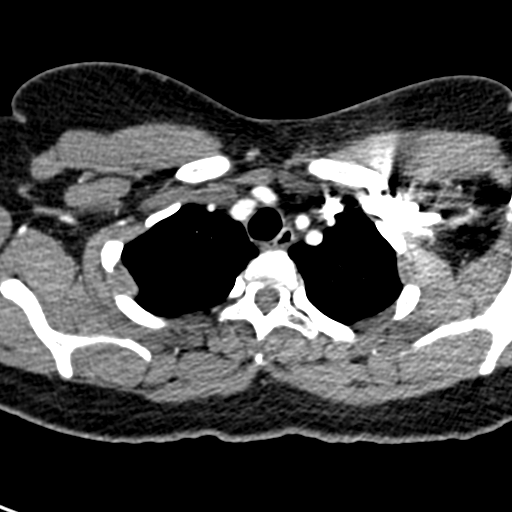
[im 208/232  lung]
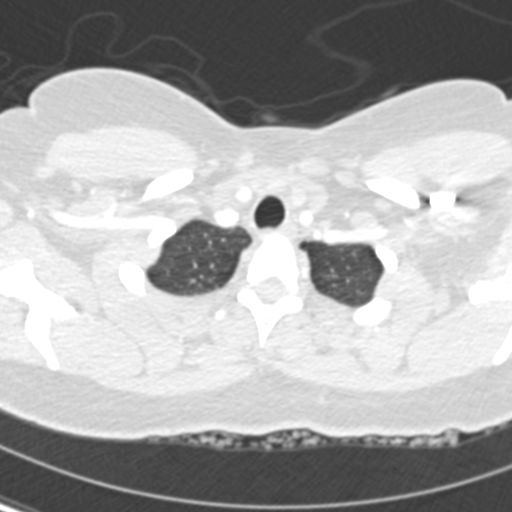
[im 220/232  mediastinal]
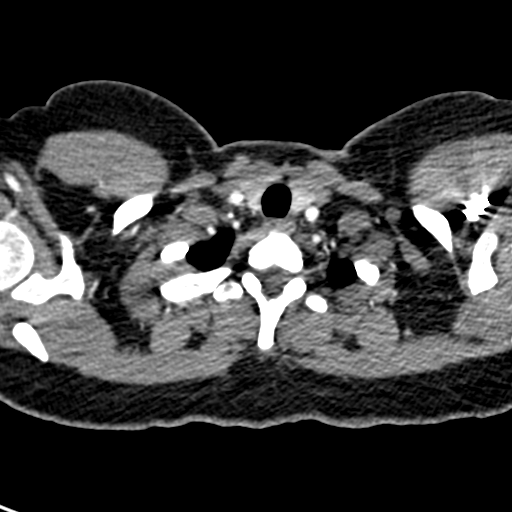

[Series 7: coronal mpr · coronal · 0.49mm/px · 1 of 68 slices shown]
[im 34/68  mediastinal]
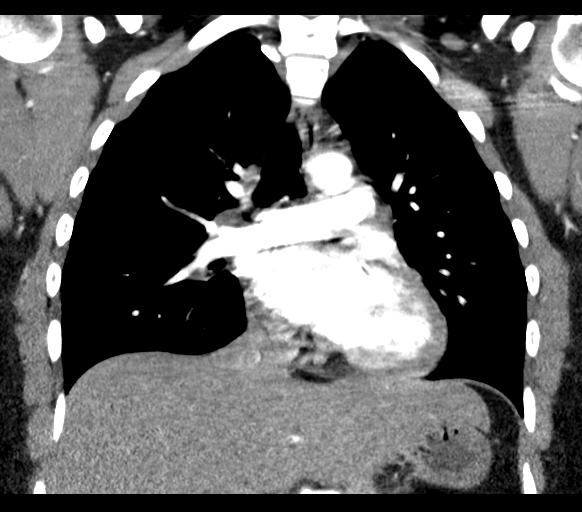

[19 of 36 positions shown; findings below may reference images not displayed]

FINDINGS: Cardiovascular: There are no filling defects in the pulmonary
arterial tree to suggest acute pulmonary thromboembolism.

Mediastinum/Nodes: No abnormal mediastinal adenopathy or pericardial
effusion

Lungs/Pleura: Clear lungs.  No pneumothorax.  No pleural effusion.

Upper Abdomen: No acute abnormality.

Musculoskeletal: No acute bony deformity.

Review of the MIP images confirms the above findings.
IMPRESSION: No evidence of acute pulmonary thromboembolism.

## 2019-05-02 ENCOUNTER — Other Ambulatory Visit: Payer: Self-pay | Admitting: Obstetrics and Gynecology

## 2019-05-02 DIAGNOSIS — N870 Mild cervical dysplasia: Secondary | ICD-10-CM | POA: Diagnosis not present

## 2019-05-02 DIAGNOSIS — N871 Moderate cervical dysplasia: Secondary | ICD-10-CM | POA: Diagnosis not present

## 2019-05-02 DIAGNOSIS — D069 Carcinoma in situ of cervix, unspecified: Secondary | ICD-10-CM | POA: Diagnosis not present

## 2019-05-02 DIAGNOSIS — Z3202 Encounter for pregnancy test, result negative: Secondary | ICD-10-CM | POA: Diagnosis not present

## 2019-07-14 ENCOUNTER — Other Ambulatory Visit: Payer: Self-pay | Admitting: Obstetrics and Gynecology

## 2019-07-14 DIAGNOSIS — N871 Moderate cervical dysplasia: Secondary | ICD-10-CM | POA: Diagnosis not present

## 2019-07-14 DIAGNOSIS — Z3202 Encounter for pregnancy test, result negative: Secondary | ICD-10-CM | POA: Diagnosis not present

## 2019-07-14 DIAGNOSIS — N898 Other specified noninflammatory disorders of vagina: Secondary | ICD-10-CM | POA: Diagnosis not present

## 2019-07-30 DIAGNOSIS — Z9889 Other specified postprocedural states: Secondary | ICD-10-CM | POA: Diagnosis not present

## 2019-11-17 DIAGNOSIS — Z3046 Encounter for surveillance of implantable subdermal contraceptive: Secondary | ICD-10-CM | POA: Diagnosis not present

## 2020-02-28 DIAGNOSIS — U071 COVID-19: Secondary | ICD-10-CM | POA: Diagnosis not present

## 2020-03-02 ENCOUNTER — Other Ambulatory Visit: Payer: Self-pay

## 2020-03-02 ENCOUNTER — Emergency Department
Admission: EM | Admit: 2020-03-02 | Discharge: 2020-03-02 | Disposition: A | Discharge status: Home / Self Care | Payer: BC Managed Care – PPO | Attending: Emergency Medicine | Admitting: Emergency Medicine

## 2020-03-02 DIAGNOSIS — Z5321 Procedure and treatment not carried out due to patient leaving prior to being seen by health care provider: Secondary | ICD-10-CM | POA: Insufficient documentation

## 2020-03-02 DIAGNOSIS — R197 Diarrhea, unspecified: Secondary | ICD-10-CM | POA: Diagnosis not present

## 2020-03-02 DIAGNOSIS — R509 Fever, unspecified: Secondary | ICD-10-CM | POA: Insufficient documentation

## 2020-03-02 DIAGNOSIS — B349 Viral infection, unspecified: Secondary | ICD-10-CM | POA: Diagnosis not present

## 2020-03-02 NOTE — ED Triage Notes (Addendum)
Pt ambulatory to triage.  Pt has diarrhea x 2.  Pt had televisit with doctor to today.  Pt states no abd pain.  No vomiting.  No cough.  Fever for 2 days Pt alert, speech clear. Pt sent to er for eval ov covid.

## 2020-05-11 DIAGNOSIS — N871 Moderate cervical dysplasia: Secondary | ICD-10-CM | POA: Diagnosis not present

## 2020-05-11 DIAGNOSIS — Z124 Encounter for screening for malignant neoplasm of cervix: Secondary | ICD-10-CM | POA: Diagnosis not present

## 2020-06-01 DIAGNOSIS — Z3202 Encounter for pregnancy test, result negative: Secondary | ICD-10-CM | POA: Diagnosis not present

## 2020-06-28 DIAGNOSIS — R87612 Low grade squamous intraepithelial lesion on cytologic smear of cervix (LGSIL): Secondary | ICD-10-CM | POA: Diagnosis not present

## 2020-06-28 DIAGNOSIS — Z3202 Encounter for pregnancy test, result negative: Secondary | ICD-10-CM | POA: Diagnosis not present

## 2020-06-28 DIAGNOSIS — B977 Papillomavirus as the cause of diseases classified elsewhere: Secondary | ICD-10-CM | POA: Diagnosis not present

## 2020-08-31 DIAGNOSIS — N926 Irregular menstruation, unspecified: Secondary | ICD-10-CM | POA: Diagnosis not present

## 2020-08-31 DIAGNOSIS — Z20822 Contact with and (suspected) exposure to covid-19: Secondary | ICD-10-CM | POA: Diagnosis not present

## 2020-08-31 DIAGNOSIS — M791 Myalgia, unspecified site: Secondary | ICD-10-CM | POA: Diagnosis not present

## 2020-08-31 DIAGNOSIS — O26891 Other specified pregnancy related conditions, first trimester: Secondary | ICD-10-CM | POA: Diagnosis not present

## 2020-08-31 DIAGNOSIS — Z3201 Encounter for pregnancy test, result positive: Secondary | ICD-10-CM | POA: Diagnosis not present

## 2020-08-31 DIAGNOSIS — R110 Nausea: Secondary | ICD-10-CM | POA: Diagnosis not present

## 2020-08-31 DIAGNOSIS — Z3A01 Less than 8 weeks gestation of pregnancy: Secondary | ICD-10-CM | POA: Diagnosis not present

## 2020-09-02 DIAGNOSIS — Z3201 Encounter for pregnancy test, result positive: Secondary | ICD-10-CM | POA: Diagnosis not present

## 2020-09-06 DIAGNOSIS — Z3201 Encounter for pregnancy test, result positive: Secondary | ICD-10-CM | POA: Diagnosis not present

## 2020-09-27 DIAGNOSIS — Z3687 Encounter for antenatal screening for uncertain dates: Secondary | ICD-10-CM | POA: Diagnosis not present

## 2020-09-27 DIAGNOSIS — Z3480 Encounter for supervision of other normal pregnancy, unspecified trimester: Secondary | ICD-10-CM | POA: Diagnosis not present

## 2020-09-27 DIAGNOSIS — Z3201 Encounter for pregnancy test, result positive: Secondary | ICD-10-CM | POA: Diagnosis not present

## 2020-09-28 LAB — OB RESULTS CONSOLE HIV ANTIBODY (ROUTINE TESTING): HIV: NONREACTIVE

## 2020-09-28 LAB — OB RESULTS CONSOLE HEPATITIS B SURFACE ANTIGEN: Hepatitis B Surface Ag: NEGATIVE

## 2020-10-04 ENCOUNTER — Other Ambulatory Visit: Payer: Self-pay

## 2020-10-04 ENCOUNTER — Encounter (HOSPITAL_COMMUNITY): Payer: Self-pay | Admitting: Obstetrics and Gynecology

## 2020-10-04 ENCOUNTER — Observation Stay (HOSPITAL_COMMUNITY): Payer: BC Managed Care – PPO

## 2020-10-04 ENCOUNTER — Observation Stay (HOSPITAL_COMMUNITY)
Admission: AD | Admit: 2020-10-04 | Discharge: 2020-10-06 | Disposition: A | Discharge status: Home / Self Care | Payer: BC Managed Care – PPO | Attending: Obstetrics and Gynecology | Admitting: Obstetrics and Gynecology

## 2020-10-04 DIAGNOSIS — O210 Mild hyperemesis gravidarum: Secondary | ICD-10-CM | POA: Diagnosis not present

## 2020-10-04 DIAGNOSIS — O3680X0 Pregnancy with inconclusive fetal viability, not applicable or unspecified: Secondary | ICD-10-CM | POA: Diagnosis not present

## 2020-10-04 DIAGNOSIS — O219 Vomiting of pregnancy, unspecified: Secondary | ICD-10-CM | POA: Diagnosis present

## 2020-10-04 DIAGNOSIS — Z20822 Contact with and (suspected) exposure to covid-19: Secondary | ICD-10-CM | POA: Diagnosis not present

## 2020-10-04 DIAGNOSIS — Z3A08 8 weeks gestation of pregnancy: Secondary | ICD-10-CM

## 2020-10-04 DIAGNOSIS — R1110 Vomiting, unspecified: Secondary | ICD-10-CM

## 2020-10-04 LAB — COMPREHENSIVE METABOLIC PANEL
ALT: 23 U/L (ref 0–44)
AST: 19 U/L (ref 15–41)
Albumin: 4.1 g/dL (ref 3.5–5.0)
Alkaline Phosphatase: 50 U/L (ref 38–126)
Anion gap: 12 (ref 5–15)
BUN: 14 mg/dL (ref 6–20)
CO2: 22 mmol/L (ref 22–32)
Calcium: 9.7 mg/dL (ref 8.9–10.3)
Chloride: 103 mmol/L (ref 98–111)
Creatinine, Ser: 0.7 mg/dL (ref 0.44–1.00)
GFR, Estimated: >60 mL/min (ref >60)
Glucose, Bld: 90 mg/dL (ref 70–99)
Potassium: 3.9 mmol/L (ref 3.5–5.1)
Sodium: 137 mmol/L (ref 135–145)
Total Bilirubin: 0.9 mg/dL (ref 0.3–1.2)
Total Protein: 8.9 g/dL — ABNORMAL HIGH (ref 6.5–8.1)

## 2020-10-04 LAB — URINALYSIS, ROUTINE W REFLEX MICROSCOPIC
Bilirubin Urine: NEGATIVE
Glucose, UA: NEGATIVE mg/dL
Hgb urine dipstick: NEGATIVE
Ketones, ur: 80 mg/dL — AB
Nitrite: NEGATIVE
Protein, ur: 100 mg/dL — AB
Specific Gravity, Urine: 1.027 (ref 1.005–1.030)
pH: 6 (ref 5.0–8.0)

## 2020-10-04 LAB — CBC
HCT: 37.4 % (ref 36.0–46.0)
Hemoglobin: 12.5 g/dL (ref 12.0–15.0)
MCH: 29.8 pg (ref 26.0–34.0)
MCHC: 33.4 g/dL (ref 30.0–36.0)
MCV: 89 fL (ref 80.0–100.0)
Platelets: 328 10*3/uL (ref 150–400)
RBC: 4.2 MIL/uL (ref 3.87–5.11)
RDW: 13.8 % (ref 11.5–15.5)
WBC: 9.8 10*3/uL (ref 4.0–10.5)
nRBC: 0 % (ref 0.0–0.2)

## 2020-10-04 LAB — RESP PANEL BY RT-PCR (FLU A&B, COVID) ARPGX2
Influenza A by PCR: NEGATIVE
Influenza B by PCR: NEGATIVE
SARS Coronavirus 2 by RT PCR: NEGATIVE

## 2020-10-04 LAB — AMYLASE: Amylase: 88 U/L (ref 28–100)

## 2020-10-04 LAB — LIPASE, BLOOD: Lipase: 62 U/L — ABNORMAL HIGH (ref 11–51)

## 2020-10-04 LAB — TSH: TSH: 0.413 u[IU]/mL (ref 0.350–4.500)

## 2020-10-04 MED ORDER — DEXTROSE IN LACTATED RINGERS 5 % IV SOLN: INTRAVENOUS | Status: AC

## 2020-10-04 MED ORDER — SODIUM CHLORIDE 0.9 % IV SOLN
25.0000 mg | Freq: Once | INTRAVENOUS | Status: AC
  Administered 2020-10-04: 25 mg via INTRAVENOUS
  Filled 2020-10-04: qty 1

## 2020-10-04 MED ORDER — METOCLOPRAMIDE HCL 5 MG/ML IJ SOLN: 10.0000 mg | Freq: Four times a day (QID) | INTRAMUSCULAR | Status: DC | PRN

## 2020-10-04 MED ORDER — METOCLOPRAMIDE HCL 5 MG/ML IJ SOLN
10.0000 mg | Freq: Once | INTRAMUSCULAR | Status: DC
Start: 2020-10-04 — End: 2020-10-06
  Filled 2020-10-04: qty 2

## 2020-10-04 MED ORDER — LACTATED RINGERS IV BOLUS
1000.0000 mL | Freq: Once | INTRAVENOUS | Status: AC
  Administered 2020-10-04: 1000 mL via INTRAVENOUS

## 2020-10-04 MED ORDER — ONDANSETRON HCL 40 MG/20ML IJ SOLN
8.0000 mg | Freq: Three times a day (TID) | INTRAMUSCULAR | Status: DC
  Administered 2020-10-04 – 2020-10-06 (×5): 8 mg via INTRAVENOUS
  Filled 2020-10-04 (×9): qty 4

## 2020-10-04 MED ORDER — LACTATED RINGERS IV BOLUS
1000.0000 mL | Freq: Once | INTRAVENOUS | Status: AC
Start: 2020-10-04 — End: 2020-10-04
  Administered 2020-10-04: 1000 mL via INTRAVENOUS

## 2020-10-04 MED ORDER — FAMOTIDINE 20 MG PO TABS
20.0000 mg | ORAL_TABLET | Freq: Two times a day (BID) | ORAL | Status: DC
  Administered 2020-10-05 – 2020-10-06 (×2): 20 mg via ORAL
  Filled 2020-10-04 (×3): qty 1

## 2020-10-04 MED ORDER — SODIUM CHLORIDE 0.9 % IV SOLN
8.0000 mg | Freq: Once | INTRAVENOUS | Status: AC
  Administered 2020-10-04: 8 mg via INTRAVENOUS
  Filled 2020-10-04: qty 4

## 2020-10-04 MED ORDER — FAMOTIDINE IN NACL 20-0.9 MG/50ML-% IV SOLN
20.0000 mg | Freq: Two times a day (BID) | INTRAVENOUS | Status: DC
  Administered 2020-10-05 (×2): 20 mg via INTRAVENOUS
  Filled 2020-10-04 (×2): qty 50

## 2020-10-04 MED ORDER — SODIUM CHLORIDE 0.9 % IV SOLN
25.0000 mg | Freq: Four times a day (QID) | INTRAVENOUS | Status: DC
  Administered 2020-10-04 – 2020-10-06 (×6): 25 mg via INTRAVENOUS
  Filled 2020-10-04 (×8): qty 1

## 2020-10-04 MED ORDER — MAGNESIUM HYDROXIDE 400 MG/5ML PO SUSP
30.0000 mL | Freq: Every day | ORAL | Status: DC
Start: 2020-10-04 — End: 2020-10-06
  Administered 2020-10-05 – 2020-10-06 (×2): 30 mL via ORAL
  Filled 2020-10-04 (×2): qty 30

## 2020-10-04 NOTE — MAU Provider Note (Signed)
History     CSN: [id number]  Arrival date and time: 10/04/20 1159   Event Date/Time   First Provider Initiated Contact with Patient 10/04/20 1327      Chief Complaint  Patient presents with   Emesis   HPI This is a 30 year old G3 P1-0-1-1 at 8 weeks and 1 day with an uncomplicated pregnancy.  She presents with 2 weeks of persistent nausea, vomiting.  She is intolerant of food and liquids, which make her nausea and vomiting worse. She has been on oral zofran, but this hasn't been working for her. She feels weak, fatigues. No fevers, chills, abdominal pain.  OB History     Gravida  3   Para  1   Term  1   Preterm      AB  1   Living  1      SAB  1   IAB      Ectopic      Multiple  0   Live Births  1           Past Medical History:  Diagnosis Date   Anxiety    no meds   Chlamydia    Eczema    Infection    UTI    Past Surgical History:  Procedure Laterality Date   NO PAST SURGERIES      Family History  Problem Relation Age of Onset   Diabetes Mother        Pre diabetes   Hypertension Father    Stroke Maternal Grandfather    Cancer Paternal Grandmother        lung   Hearing loss Paternal Grandfather        with age    Social History   Tobacco Use   Smoking status: Never   Smokeless tobacco: Never  Vaping Use   Vaping Use: Never used  Substance Use Topics   Alcohol use: No   Drug use: No    Allergies: No Known Allergies  Medications Prior to Admission  Medication Sig Dispense Refill Last Dose   ondansetron (ZOFRAN-ODT) 8 MG disintegrating tablet Take 1 tablet (8 mg total) by mouth every 8 (eight) hours. (Patient taking differently: Take 8 mg by mouth daily as needed for nausea or vomiting.) 30 tablet 2 10/03/2020 at 2000   ferrous sulfate 300 (60 FE) MG/5ML syrup Take 5 mLs (300 mg total) by mouth 3 (three) times daily with meals. Dispense a 30 day supply. 150 mL 3    ibuprofen (ADVIL,MOTRIN) 100 MG/5ML suspension Take 30 mLs (600  mg total) by mouth every 6 (six) hours. 500 mL 2    Prenatal Vit-Fe Fumarate-FA (PRENATAL MULTIVITAMIN) TABS tablet Take 1 tablet by mouth daily at 12 noon.       Review of Systems  Constitutional:  Negative for chills and fever.  Respiratory:  Negative for cough, shortness of breath and wheezing.   Cardiovascular:  Negative for chest pain.  All other systems reviewed and are negative. Physical Exam   Blood pressure 110/69, pulse 97, temperature 98.1 F (36.7 C), temperature source Oral, resp. rate 19, height 5\' 1"  (1.549 m), weight 71.7 kg, last menstrual period 03/02/2020, SpO2 100 %, unknown if currently breastfeeding.  Physical Exam Vitals reviewed.  Constitutional:      Appearance: Normal appearance.  HENT:     Head: Normocephalic and atraumatic.     Mouth/Throat:     Mouth: Mucous membranes are dry.  Cardiovascular:     Rate and  Rhythm: Normal rate and regular rhythm.     Pulses: Normal pulses.  Pulmonary:     Effort: Pulmonary effort is normal.  Abdominal:     General: Abdomen is flat. There is no distension.     Palpations: Abdomen is soft. There is no mass.     Tenderness: There is no abdominal tenderness. There is no guarding or rebound.     Hernia: No hernia is present.  Neurological:     Mental Status: She is alert.  Psychiatric:        Mood and Affect: Mood normal.        Thought Content: Thought content normal.   Results for orders placed or performed during the hospital encounter of 10/04/20 (from the past 24 hour(s))  Comprehensive metabolic panel     Status: Abnormal   Collection Time: 10/04/20  1:57 PM  Result Value Ref Range   Sodium 137 135 - 145 mmol/L   Potassium 3.9 3.5 - 5.1 mmol/L   Chloride 103 98 - 111 mmol/L   CO2 22 22 - 32 mmol/L   Glucose, Bld 90 70 - 99 mg/dL   BUN 14 6 - 20 mg/dL   Creatinine, Ser 8.29 0.44 - 1.00 mg/dL   Calcium 9.7 8.9 - 93.7 mg/dL   Total Protein 8.9 (H) 6.5 - 8.1 g/dL   Albumin 4.1 3.5 - 5.0 g/dL   AST 19 15 -  41 U/L   ALT 23 0 - 44 U/L   Alkaline Phosphatase 50 38 - 126 U/L   Total Bilirubin 0.9 0.3 - 1.2 mg/dL   GFR, Estimated >16 >96 mL/min   Anion gap 12 5 - 15  CBC     Status: None   Collection Time: 10/04/20  1:57 PM  Result Value Ref Range   WBC 9.8 4.0 - 10.5 K/uL   RBC 4.20 3.87 - 5.11 MIL/uL   Hemoglobin 12.5 12.0 - 15.0 g/dL   HCT 78.9 38.1 - 01.7 %   MCV 89.0 80.0 - 100.0 fL   MCH 29.8 26.0 - 34.0 pg   MCHC 33.4 30.0 - 36.0 g/dL   RDW 51.0 25.8 - 52.7 %   Platelets 328 150 - 400 K/uL   nRBC 0.0 0.0 - 0.2 %  Urinalysis, Routine w reflex microscopic Urine, Clean Catch     Status: Abnormal   Collection Time: 10/04/20  1:58 PM  Result Value Ref Range   Color, Urine AMBER (A) YELLOW   APPearance CLOUDY (A) CLEAR   Specific Gravity, Urine 1.027 1.005 - 1.030   pH 6.0 5.0 - 8.0   Glucose, UA NEGATIVE NEGATIVE mg/dL   Hgb urine dipstick NEGATIVE NEGATIVE   Bilirubin Urine NEGATIVE NEGATIVE   Ketones, ur 80 (A) NEGATIVE mg/dL   Protein, ur 782 (A) NEGATIVE mg/dL   Nitrite NEGATIVE NEGATIVE   Leukocytes,Ua SMALL (A) NEGATIVE   RBC / HPF 6-10 0 - 5 RBC/hpf   WBC, UA 6-10 0 - 5 WBC/hpf   Bacteria, UA RARE (A) NONE SEEN   Squamous Epithelial / LPF 11-20 0 - 5   Mucus PRESENT    Amorphous Crystal PRESENT      MAU Course  Procedures  MDM 2L IVF given, Zofran 8mg . Trial PO - patient vomited Change IVF to D5LR and gave Phenergan 25mg .  Patient still nauseated.  Assessment and Plan   1. [redacted] weeks gestation of pregnancy   2. Hyperemesis    As patient not improving, discussed patient with Dr  Connye Burkitt - will admit.   Levie Heritage 10/04/2020, 1:36 PM

## 2020-10-04 NOTE — MAU Note (Signed)
Pt reports she has had n/v for 2 weeks. Can't keep anything down. Feels dehydrated and fatigued. Also reports she has not had a BM x 2 weeks. Denies any pain, vag bleeding or discharge.

## 2020-10-04 NOTE — H&P (Signed)
HPI: 30 y.o. G3P1011 @ [redacted]w[redacted]d estimated gestational age (as dated by LMP c/w 7 week ultrasound) presents for N/V during pregnancy with inability to tolerate PO.  Over the past week, patient has not been able to keep food or liquids down. Has not had a BM in 2 weeks. Vomiting up to 7 times daily. Reports 10lb weight loss.  Failed trial of Zofran and Phenergan IV while in MAU. Failed outpatient trial of both Zofran and Phenergan PO.  Prenatal care has been provided by Dr. Steva Ready [facility])  ROS:  Denies fevers, chills, chest pain, visual changes, SOB, RUQ/epigastric pain, N/V, dysuria, hematuria, or sudden onset/worsening bilateral LE or facial edema.  Pregnancy complicated by: History of hyperemesis gravidarum Anxiety History of CIN II   Prenatal Transfer Tool  Maternal Diabetes: No Genetic Screening: Has not had yet, early gestation Maternal Ultrasounds/Referrals: Normal Fetal Ultrasounds or other Referrals:  None Maternal Substance Abuse:  No Significant Maternal Medications:  None Significant Maternal Lab Results: None   Prenatal Labs Blood type:  O Positive Antibody screen:  Negative CBC:  H/H 11.6/35.6 Rubella: Immune RPR:  Non-reactive Hep B:  Negative Hep C:  Negative HIV:  Negative GC/CT:  Negative Glucola:  N/A, early gestation  Immunizations: Tdap: Not yet Flu: Not yet  OBHx:  OB History     Gravida  3   Para  1   Term  1   Preterm      AB  1   Living  1      SAB  1   IAB      Ectopic      Multiple  0   Live Births  1          PMHx:  See above Meds:  PNV, Zofran PRN Allergy:  No Known Allergies SurgHx: None SocHx:   Denies Tobacco, ETOH, illicit drugs  O: BP 113/72 (BP Location: Right Arm)   Pulse 91   Temp 98.8 F (37.1 C) (Oral)   Resp 18   Ht 5\' 1"  (1.549 m)   Wt 71.7 kg   LMP 03/02/2020 (Exact Date)   SpO2 100%   BMI 29.85 kg/m  Gen. AAOx3, NAD CV.  RRR  Resp. CTAB, no wheezes/rales/rhonchi Abd. Gravid,  soft, non-tender throughout, no rebound/guarding Extr.  No bilateral LE edema, no calf tenderness bilaterally  Last 04/30/2020 (8/1):  [redacted]w[redacted]d viable single IUP  CMP Latest Ref Rng & Units 10/04/2020 08/20/2017 02/14/2014  Glucose 70 - 99 mg/dL 90 89 76  BUN 6 - 20 mg/dL 14 10 3(L)  Creatinine 0.44 - 1.00 mg/dL 02/16/2014 7.16 9.67)  Sodium 135 - 145 mmol/L 137 137 133(L)  Potassium 3.5 - 5.1 mmol/L 3.9 3.7 3.3(L)  Chloride 98 - 111 mmol/L 103 105 98  CO2 22 - 32 mmol/L 22 24 24   Calcium 8.9 - 10.3 mg/dL 9.7 9.3 8.5  Total Protein 6.5 - 8.1 g/dL 8.9(H) - 6.1  Total Bilirubin 0.3 - 1.2 mg/dL 0.9 - 0.3  Alkaline Phos 38 - 126 U/L 50 - 38(L)  AST 15 - 41 U/L 19 - 23  ALT 0 - 44 U/L 23 - 24   CBC    Component Value Date/Time   WBC 9.8 10/04/2020 1357   RBC 4.20 10/04/2020 1357   HGB 12.5 10/04/2020 1357   HCT 37.4 10/04/2020 1357   PLT 328 10/04/2020 1357   MCV 89.0 10/04/2020 1357   MCH 29.8 10/04/2020 1357   MCHC 33.4 10/04/2020 1357  RDW 13.8 10/04/2020 1357   LYMPHSABS 1.1 02/04/2014 0359   MONOABS 0.9 02/04/2014 0359   EOSABS 0.0 02/04/2014 0359   BASOSABS 0.0 02/04/2014 0359     A/P:  30 y.o. G3P1011 @ [redacted]w[redacted]d who is admitted for hyperemesis gravidarum.  - Admit to [facility] - [facility] - Admit labs: and CBC unremarkable, TSH, Amylase and Lipase added - Imaging: Limited OB US to confirm fetal heart tones - Diet:  As tolerated (clears to regular) - IV Anti-emetics: Zofran 8mg  q8h, Phenergan 25mg  q6h, Reglan 10mg  q6h PRN - IVF:  S/p 2L IVF bolus followed by D5LR bolus, D5LR at 125cc/hour ordered - VTE Prophylaxis:  SCDs - Will reassess for discharge home tomorrow  , DO [phone] (office)

## 2020-10-05 DIAGNOSIS — O210 Mild hyperemesis gravidarum: Secondary | ICD-10-CM | POA: Diagnosis not present

## 2020-10-05 DIAGNOSIS — Z20822 Contact with and (suspected) exposure to covid-19: Secondary | ICD-10-CM | POA: Diagnosis not present

## 2020-10-05 DIAGNOSIS — O219 Vomiting of pregnancy, unspecified: Secondary | ICD-10-CM | POA: Diagnosis not present

## 2020-10-05 DIAGNOSIS — Z3A08 8 weeks gestation of pregnancy: Secondary | ICD-10-CM | POA: Diagnosis not present

## 2020-10-05 MED ORDER — LACTATED RINGERS IV SOLN: INTRAVENOUS | Status: DC

## 2020-10-05 NOTE — Progress Notes (Signed)
This chaplain responded to [facility] consult for creating or updating the Pt. Advance Directive.    The chaplain understands the Pt. is interested in completing an Advance Directive, but declined Advance Directive education today.  The chaplain left AD educational materials with the Pt.  The Pt. agreed to the F/U on Wednesday.

## 2020-10-05 NOTE — Progress Notes (Signed)
Call was made to patient into room. Has only tolerated some clears today. Still feeling nauseated and does not feel ready for discharge home. Will continue to monitor overnight while working toward tolerating regular diet.  Steva Ready, DO

## 2020-10-05 NOTE — Progress Notes (Signed)
Antepartum Progress Note  Subjective: Patient feeling okay. She did not vomit overnight. Tolerated a popsicle. Still feels nauseated. Feels hungry now and desires to try to eat and work her way up to solids. She does not feel comfortable overall and does not feel hopeful that she will be able to go home today. Denies fevers, chills, chest pain, SOB, abdominal pain, or bilateral LE edema.  Objective: BP (!) 93/50 (BP Location: Right Arm)   Pulse 88   Temp 98.4 F (36.9 C) (Oral)   Resp 14   Ht 5\' 1"  (1.549 m)   Wt 75.5 kg   LMP 03/02/2020 (Exact Date)   SpO2 98%   BMI 31.44 kg/m  Gen:  NAD, pleasant and cooperative Cardio:  RRR Pulm:  CTAB, no wheezes/rales/rhonchi Abd:  Soft, non-distended, non-tender throughout, no rebound/guarding Ext:  No bilateral LE edema, no bilateral calf tenderness  CBC Latest Ref Rng & Units 10/04/2020 08/20/2017 09/02/2014  WBC 4.0 - 10.5 K/uL 9.8 6.3 14.2(H)  Hemoglobin 12.0 - 15.0 g/dL 11/03/2014 40.9 81.1)  Hematocrit 36.0 - 46.0 % 37.4 39.3 22.0(L)  Platelets 150 - 400 K/uL 328 282 236   CMP Latest Ref Rng & Units 10/04/2020 08/20/2017 02/14/2014  Glucose 70 - 99 mg/dL 90 89 76  BUN 6 - 20 mg/dL 14 10 3(L)  Creatinine 0.44 - 1.00 mg/dL 02/16/2014 8.29 5.62)  Sodium 135 - 145 mmol/L 137 137 133(L)  Potassium 3.5 - 5.1 mmol/L 3.9 3.7 3.3(L)  Chloride 98 - 111 mmol/L 103 105 98  CO2 22 - 32 mmol/L 22 24 24   Calcium 8.9 - 10.3 mg/dL 9.7 9.3 8.5  Total Protein 6.5 - 8.1 g/dL 8.9(H) - 6.1  Total Bilirubin 0.3 - 1.2 mg/dL 0.9 - 0.3  Alkaline Phos 38 - 126 U/L 50 - 38(L)  AST 15 - 41 U/L 19 - 23  ALT 0 - 44 U/L 23 - 24    Amylase    Component Value Date/Time   AMYLASE 88 10/04/2020 1357   Lipase     Component Value Date/Time   LIPASE 62 (H) 10/04/2020 1357   TSH: 0.413 (wnl)  A/P: G3P1011 at [redacted]w[redacted]d admitted for hyperemesis gravidarum.  - Diet as tolerated - patient to attempt working her way from clears to solids today - Continue anti-emetics: Zofran  8mg  q8h, Phenergan 25mg  q6h, Reglan 10mg  q6h PRN - Discussed risks/benefits to steroid taper if needed - Continue maintenance IVF - Labs as documented above (overall unremarkable) - Discharge pending ability to tolerate PO - Will reassess for discharge this afternoon, may need additional overnight stay  12/04/2020, DO

## 2020-10-06 DIAGNOSIS — Z20822 Contact with and (suspected) exposure to covid-19: Secondary | ICD-10-CM | POA: Diagnosis not present

## 2020-10-06 DIAGNOSIS — O219 Vomiting of pregnancy, unspecified: Secondary | ICD-10-CM | POA: Diagnosis not present

## 2020-10-06 DIAGNOSIS — Z3A08 8 weeks gestation of pregnancy: Secondary | ICD-10-CM | POA: Diagnosis not present

## 2020-10-06 DIAGNOSIS — O210 Mild hyperemesis gravidarum: Secondary | ICD-10-CM | POA: Diagnosis not present

## 2020-10-06 MED ORDER — PROMETHAZINE HCL 25 MG PO TABS: 25.0000 mg | ORAL_TABLET | Freq: Four times a day (QID) | ORAL | 3 refills | Status: DC

## 2020-10-06 MED ORDER — ONDANSETRON 8 MG PO TBDP: 8.0000 mg | ORAL_TABLET | Freq: Three times a day (TID) | ORAL | 3 refills | Status: DC

## 2020-10-06 MED ORDER — PROMETHAZINE HCL 25 MG PO TABS
25.0000 mg | ORAL_TABLET | Freq: Four times a day (QID) | ORAL | Status: DC
  Administered 2020-10-06 (×2): 25 mg via ORAL
  Filled 2020-10-06 (×2): qty 1

## 2020-10-06 MED ORDER — ONDANSETRON 4 MG PO TBDP
8.0000 mg | ORAL_TABLET | Freq: Three times a day (TID) | ORAL | Status: DC
  Administered 2020-10-06: 8 mg via ORAL
  Filled 2020-10-06: qty 2

## 2020-10-06 NOTE — Progress Notes (Signed)
In to re-evaluate patient home for discharge. She is feeling much better. States she has eaten breakfast and lunch without vomiting. Ate a eggs and a cheeseburger today. Feels hungry for breakfast. Just took milk of magnesia to aid with a bowel movement. Had a small bowel movement while here. Feels ready for discharge home. We discussed alternating Zofran and Phenergan. Has first OB visit arranged outpatient.  Steva Ready, DO

## 2020-10-06 NOTE — Progress Notes (Signed)
Antepartum Progress Note  Subjective: Patient feeling better this morning. States Phenergan really helps the best. She was able to eat some macaroni and cheese last night and did not vomit it. Feels ready to order breakfast. dDenies fevers, chills, chest pain, SOB, abdominal pain, or bilateral LE edema.  Objective: BP (!) 103/54 (BP Location: Right Arm)   Pulse 82   Temp 98 F (36.7 C) (Oral)   Resp 16   Ht 5\' 1"  (1.549 m)   Wt 75.5 kg   LMP 03/02/2020 (Exact Date)   SpO2 98%   BMI 31.44 kg/m  Gen:  NAD, pleasant and cooperative Cardio:  RRR Pulm:  CTAB, no wheezes/rales/rhonchi Abd:  Soft, non-distended, non-tender throughout, no rebound/guarding Ext:  No bilateral LE edema, no bilateral calf tenderness  CBC Latest Ref Rng & Units 10/04/2020 08/20/2017 09/02/2014  WBC 4.0 - 10.5 K/uL 9.8 6.3 14.2(H)  Hemoglobin 12.0 - 15.0 g/dL 11/03/2014 09.8 11.9)  Hematocrit 36.0 - 46.0 % 37.4 39.3 22.0(L)  Platelets 150 - 400 K/uL 328 282 236   CMP Latest Ref Rng & Units 10/04/2020 08/20/2017 02/14/2014  Glucose 70 - 99 mg/dL 90 89 76  BUN 6 - 20 mg/dL 14 10 3(L)  Creatinine 0.44 - 1.00 mg/dL 02/16/2014 2.95 6.21)  Sodium 135 - 145 mmol/L 137 137 133(L)  Potassium 3.5 - 5.1 mmol/L 3.9 3.7 3.3(L)  Chloride 98 - 111 mmol/L 103 105 98  CO2 22 - 32 mmol/L 22 24 24   Calcium 8.9 - 10.3 mg/dL 9.7 9.3 8.5  Total Protein 6.5 - 8.1 g/dL 8.9(H) - 6.1  Total Bilirubin 0.3 - 1.2 mg/dL 0.9 - 0.3  Alkaline Phos 38 - 126 U/L 50 - 38(L)  AST 15 - 41 U/L 19 - 23  ALT 0 - 44 U/L 23 - 24    Amylase    Component Value Date/Time   AMYLASE 88 10/04/2020 1357   Lipase     Component Value Date/Time   LIPASE 62 (H) 10/04/2020 1357   TSH: 0.413 (wnl)  A/P: G3P1011 at [redacted]w[redacted]d admitted for hyperemesis gravidarum.  - Diet as tolerated - patient to attempt regular diet today - Continue anti-emetics: Zofran ODT 8mg  q8h, Phenergan 25mg  q6h PO, Reglan IV 10mg  q6h PRN - Continue maintenance IVF - Labs as documented  above (overall unremarkable) - Will re-evaluate this afternoon for discharge after transition to PO anti-emetics today  12/04/2020, DO

## 2020-10-06 NOTE — Plan of Care (Signed)
  Problem: Education: Goal: Knowledge of General Education information will improve Description: Including pain rating scale, medication(s)/side effects and non-pharmacologic comfort measures Outcome: Adequate for Discharge   Problem: Health Behavior/Discharge Planning: Goal: Ability to manage health-related needs will improve Outcome: Adequate for Discharge   Problem: Clinical Measurements: Goal: Ability to maintain clinical measurements within normal limits will improve Outcome: Adequate for Discharge Goal: Will remain free from infection Outcome: Adequate for Discharge Goal: Diagnostic test results will improve Outcome: Adequate for Discharge Goal: Respiratory complications will improve Outcome: Adequate for Discharge Goal: Cardiovascular complication will be avoided Outcome: Adequate for Discharge   Problem: Nutrition: Goal: Adequate nutrition will be maintained Outcome: Adequate for Discharge   Problem: Education: Goal: Knowledge of disease or condition will improve Outcome: Adequate for Discharge Goal: Knowledge of the prescribed therapeutic regimen will improve Outcome: Adequate for Discharge Goal: Individualized Educational Video(s) Outcome: Adequate for Discharge   Problem: Clinical Measurements: Goal: Complications related to the disease process, condition or treatment will be avoided or minimized Outcome: Adequate for Discharge

## 2020-10-06 NOTE — Discharge Summary (Signed)
Physician Discharge Summary  Patient ID: Chelsea Small MRN: [id number] DOB/AGE: 11/01/1990 30 y.o.  Admit date: 10/04/2020 Discharge date: 10/06/2020  Admission Diagnoses: Hyperemesis gravidarum First trimester pregnancy  Discharge Diagnoses:  Active Problems:   Hyperemesis gravidarum   Nausea and vomiting during pregnancy   Discharged Condition: good  Hospital Course: Patient is a G3P1011 at [redacted]w[redacted]d who was admitted on 10/04/20 for nausea/vomiting x 2 weeks, inability to tolerate PO, and weight loss of 10 pounds. She was admitted for IV hydration and IV antiemetics. Zofran and Phenergan helped with her symptoms. No significant  lab abnormalities. Prior to discharge, patient was tolerating PO without vomiting and able to use PO anti-emetics. She was discharged home in good condition. Please refer to hospital chart for specific details.  Consults: None  Significant Diagnostic Studies: labs: See chart  Treatments: IV hydration and anti-emetics  Discharge Exam: Blood pressure 99/69, pulse 92, temperature 98.7 F (37.1 C), temperature source Oral, resp. rate 18, height 5\' 1"  (1.549 m), weight 75.8 kg, last menstrual period 03/02/2020, SpO2 99 %, unknown if currently breastfeeding. Gen:  NAD, pleasant and cooperative Cardio:  RRR Pulm:  CTAB, no wheezes/rales/rhonchi Abd:  Soft, non-distended, non-tender throughout, no rebound/guarding Ext:  No bilateral LE edema, no bilateral calf tenderness  Disposition: Discharge disposition: 01-Home or Self Care       Allergies as of 10/06/2020   No Known Allergies      Medication List     STOP taking these medications    ondansetron 4 MG tablet Commonly known as: ZOFRAN       TAKE these medications    ondansetron 8 MG disintegrating tablet Commonly known as: ZOFRAN-ODT Take 1 tablet (8 mg total) by mouth every 8 (eight) hours.   prenatal multivitamin Tabs tablet Take 1 tablet by mouth daily at 12 noon.   promethazine 25 MG  tablet Commonly known as: PHENERGAN Take 1 tablet (25 mg total) by mouth every 6 (six) hours. Start taking on: October 07, 2020        Follow-up Information     October 09, 2020, DO Follow up in 2 week(s).   Specialty: Obstetrics and Gynecology Why: Patient has new OB visit scheduled. Contact information: [address] [place] [address] [place] [id number]                 Signed: [phone] 10/06/2020, 6:26 PM

## 2020-10-16 LAB — OB RESULTS CONSOLE ABO/RH: RH Type: POSITIVE

## 2020-10-18 LAB — OB RESULTS CONSOLE GC/CHLAMYDIA
Chlamydia: NEGATIVE
Gonorrhea: NEGATIVE

## 2020-10-18 LAB — OB RESULTS CONSOLE RUBELLA ANTIBODY, IGM: Rubella: IMMUNE

## 2020-10-18 LAB — OB RESULTS CONSOLE RPR: RPR: NONREACTIVE

## 2020-10-19 DIAGNOSIS — Z3481 Encounter for supervision of other normal pregnancy, first trimester: Secondary | ICD-10-CM | POA: Diagnosis not present

## 2020-10-19 DIAGNOSIS — N898 Other specified noninflammatory disorders of vagina: Secondary | ICD-10-CM | POA: Diagnosis not present

## 2020-10-19 DIAGNOSIS — Z3490 Encounter for supervision of normal pregnancy, unspecified, unspecified trimester: Secondary | ICD-10-CM | POA: Diagnosis not present

## 2020-10-26 DIAGNOSIS — Z315 Encounter for genetic counseling: Secondary | ICD-10-CM | POA: Diagnosis not present

## 2020-10-26 DIAGNOSIS — Z3481 Encounter for supervision of other normal pregnancy, first trimester: Secondary | ICD-10-CM | POA: Diagnosis not present

## 2020-11-23 DIAGNOSIS — N898 Other specified noninflammatory disorders of vagina: Secondary | ICD-10-CM | POA: Diagnosis not present

## 2020-11-30 DIAGNOSIS — O3442 Maternal care for other abnormalities of cervix, second trimester: Secondary | ICD-10-CM | POA: Diagnosis not present

## 2020-12-21 DIAGNOSIS — Z3482 Encounter for supervision of other normal pregnancy, second trimester: Secondary | ICD-10-CM | POA: Diagnosis not present

## 2020-12-21 DIAGNOSIS — Z36 Encounter for antenatal screening for chromosomal anomalies: Secondary | ICD-10-CM | POA: Diagnosis not present

## 2021-01-18 DIAGNOSIS — O3442 Maternal care for other abnormalities of cervix, second trimester: Secondary | ICD-10-CM | POA: Diagnosis not present

## 2021-02-14 DIAGNOSIS — Z23 Encounter for immunization: Secondary | ICD-10-CM | POA: Diagnosis not present

## 2021-02-14 DIAGNOSIS — Z3482 Encounter for supervision of other normal pregnancy, second trimester: Secondary | ICD-10-CM | POA: Diagnosis not present

## 2021-02-14 DIAGNOSIS — Z3490 Encounter for supervision of normal pregnancy, unspecified, unspecified trimester: Secondary | ICD-10-CM | POA: Diagnosis not present

## 2021-03-17 DIAGNOSIS — Z3483 Encounter for supervision of other normal pregnancy, third trimester: Secondary | ICD-10-CM | POA: Diagnosis not present

## 2021-04-12 DIAGNOSIS — Z3483 Encounter for supervision of other normal pregnancy, third trimester: Secondary | ICD-10-CM | POA: Diagnosis not present

## 2021-04-12 DIAGNOSIS — Z3490 Encounter for supervision of normal pregnancy, unspecified, unspecified trimester: Secondary | ICD-10-CM | POA: Diagnosis not present

## 2021-04-12 DIAGNOSIS — O99213 Obesity complicating pregnancy, third trimester: Secondary | ICD-10-CM | POA: Diagnosis not present

## 2021-04-12 LAB — OB RESULTS CONSOLE GBS: GBS: NEGATIVE

## 2021-04-12 LAB — OB RESULTS CONSOLE GC/CHLAMYDIA
Chlamydia: NEGATIVE
Gonorrhea: NEGATIVE

## 2021-05-01 ENCOUNTER — Other Ambulatory Visit: Payer: Self-pay

## 2021-05-01 ENCOUNTER — Inpatient Hospital Stay (HOSPITAL_COMMUNITY)
Admission: AD | Admit: 2021-05-01 | Discharge: 2021-05-02 | Disposition: A | Discharge status: Home / Self Care | Payer: BC Managed Care – PPO | Source: Ambulatory Visit | Attending: Obstetrics & Gynecology | Admitting: Obstetrics & Gynecology

## 2021-05-01 ENCOUNTER — Inpatient Hospital Stay (HOSPITAL_COMMUNITY): Payer: BC Managed Care – PPO

## 2021-05-01 ENCOUNTER — Encounter (HOSPITAL_COMMUNITY): Payer: Self-pay | Admitting: Obstetrics & Gynecology

## 2021-05-01 DIAGNOSIS — D649 Anemia, unspecified: Secondary | ICD-10-CM | POA: Diagnosis not present

## 2021-05-01 DIAGNOSIS — R0602 Shortness of breath: Secondary | ICD-10-CM | POA: Diagnosis not present

## 2021-05-01 DIAGNOSIS — O98513 Other viral diseases complicating pregnancy, third trimester: Secondary | ICD-10-CM | POA: Diagnosis not present

## 2021-05-01 DIAGNOSIS — O99013 Anemia complicating pregnancy, third trimester: Secondary | ICD-10-CM | POA: Diagnosis not present

## 2021-05-01 DIAGNOSIS — J029 Acute pharyngitis, unspecified: Secondary | ICD-10-CM | POA: Insufficient documentation

## 2021-05-01 DIAGNOSIS — Z3A38 38 weeks gestation of pregnancy: Secondary | ICD-10-CM

## 2021-05-01 DIAGNOSIS — R509 Fever, unspecified: Secondary | ICD-10-CM | POA: Diagnosis not present

## 2021-05-01 DIAGNOSIS — O26893 Other specified pregnancy related conditions, third trimester: Secondary | ICD-10-CM | POA: Insufficient documentation

## 2021-05-01 DIAGNOSIS — U071 COVID-19: Secondary | ICD-10-CM

## 2021-05-01 DIAGNOSIS — I517 Cardiomegaly: Secondary | ICD-10-CM | POA: Diagnosis not present

## 2021-05-01 LAB — CBC WITH DIFFERENTIAL/PLATELET
Abs Immature Granulocytes: 0.15 10*3/uL — ABNORMAL HIGH (ref 0.00–0.07)
Basophils Absolute: 0 10*3/uL (ref 0.0–0.1)
Basophils Relative: 0 %
Eosinophils Absolute: 0 10*3/uL (ref 0.0–0.5)
Eosinophils Relative: 0 %
HCT: 25.3 % — ABNORMAL LOW (ref 36.0–46.0)
Hemoglobin: 7.9 g/dL — ABNORMAL LOW (ref 12.0–15.0)
Immature Granulocytes: 2 %
Lymphocytes Relative: 6 %
Lymphs Abs: 0.4 10*3/uL — ABNORMAL LOW (ref 0.7–4.0)
MCH: 27.1 pg (ref 26.0–34.0)
MCHC: 31.2 g/dL (ref 30.0–36.0)
MCV: 86.6 fL (ref 80.0–100.0)
Monocytes Absolute: 0.7 10*3/uL (ref 0.1–1.0)
Monocytes Relative: 10 %
Neutro Abs: 5.5 10*3/uL (ref 1.7–7.7)
Neutrophils Relative %: 82 %
Platelets: 208 10*3/uL (ref 150–400)
RBC: 2.92 MIL/uL — ABNORMAL LOW (ref 3.87–5.11)
RDW: 15.2 % (ref 11.5–15.5)
WBC: 6.8 10*3/uL (ref 4.0–10.5)
nRBC: 1 % — ABNORMAL HIGH (ref 0.0–0.2)

## 2021-05-01 LAB — COMPREHENSIVE METABOLIC PANEL
ALT: 12 U/L (ref 0–44)
AST: 19 U/L (ref 15–41)
Albumin: 3 g/dL — ABNORMAL LOW (ref 3.5–5.0)
Alkaline Phosphatase: 118 U/L (ref 38–126)
Anion gap: 11 (ref 5–15)
BUN: 5 mg/dL — ABNORMAL LOW (ref 6–20)
CO2: 19 mmol/L — ABNORMAL LOW (ref 22–32)
Calcium: 8.4 mg/dL — ABNORMAL LOW (ref 8.9–10.3)
Chloride: 103 mmol/L (ref 98–111)
Creatinine, Ser: 0.49 mg/dL (ref 0.44–1.00)
GFR, Estimated: >60 mL/min (ref >60)
Glucose, Bld: 82 mg/dL (ref 70–99)
Potassium: 3.7 mmol/L (ref 3.5–5.1)
Sodium: 133 mmol/L — ABNORMAL LOW (ref 135–145)
Total Bilirubin: 0.4 mg/dL (ref 0.3–1.2)
Total Protein: 6.9 g/dL (ref 6.5–8.1)

## 2021-05-01 LAB — URINALYSIS, ROUTINE W REFLEX MICROSCOPIC
Bilirubin Urine: NEGATIVE
Glucose, UA: NEGATIVE mg/dL
Hgb urine dipstick: NEGATIVE
Ketones, ur: 20 mg/dL — AB
Nitrite: NEGATIVE
Protein, ur: NEGATIVE mg/dL
Specific Gravity, Urine: 1.014 (ref 1.005–1.030)
pH: 6 (ref 5.0–8.0)

## 2021-05-01 LAB — RESP PANEL BY RT-PCR (FLU A&B, COVID) ARPGX2
Influenza A by PCR: NEGATIVE
Influenza B by PCR: NEGATIVE
SARS Coronavirus 2 by RT PCR: POSITIVE — AB

## 2021-05-01 MED ORDER — LACTATED RINGERS IV BOLUS
1000.0000 mL | Freq: Once | INTRAVENOUS | Status: AC
  Administered 2021-05-01: 1000 mL via INTRAVENOUS

## 2021-05-01 MED ORDER — SODIUM CHLORIDE 0.9 % IV SOLN
510.0000 mg | Freq: Once | INTRAVENOUS | Status: AC
  Administered 2021-05-01: 510 mg via INTRAVENOUS
  Filled 2021-05-01: qty 17

## 2021-05-01 MED ORDER — ACETAMINOPHEN 500 MG PO TABS
1000.0000 mg | ORAL_TABLET | Freq: Once | ORAL | Status: AC
  Administered 2021-05-01: 1000 mg via ORAL
  Filled 2021-05-01: qty 2

## 2021-05-01 NOTE — MAU Note (Signed)
..  Chelsea Small is a 31 y.o. at [redacted]w[redacted]d here in MAU reporting: dry, nonproductive cough, fever of 100.4 around 1830, and SOB at rest that got increasingly worse today more than normal pregnancy fatigue. Pt report VB with voiding, but OB aware. Pt reports HA newly onset today, took tylenol extra strength around 1200 and 1700, with little relief. Pt denies DFM, LOF, PIH s/s, abnormal discharge, and complication in the pregnancy. Pt is tachycardic in triage.   ? ?Onset of complaint: 0500 ?Pain score: 0/10 ?Vitals:  ? 05/01/21 1937 05/01/21 1938  ?BP:  (!) 109/59  ?Pulse:  (!) 130  ?Resp:  (!) 24  ?Temp:  99.7 ?F (37.6 ?C)  ?SpO2: 97%   ?   ?FHT:165 ?Lab orders placed from triage:  UA, COVID/ FLU ? ?

## 2021-05-01 NOTE — MAU Provider Note (Signed)
?History  ?  ? ?CSN: [id number] ? ?Arrival date and time: 05/01/21 1922 ? ? None  ?  ?Chief Complaint  ?Patient presents with  ? Cough  ? Fever  ? Shortness of Breath  ? ?HPI ?Chelsea Small is a 31 y.o. G3P1011 at [redacted]w[redacted]d who presents to MAU for sore throat, dry cough, headache, and fever. Patient reports symptoms started this morning. Reports highest temp was 100.4 at home. Denies chest pain, but reports shortness of breath. This has been an ongoing issue throughout her pregnancy. Has taken Tylenol, last took around 5pm, although unsure of the dose. She denies congestion or sick contacts. Had one episode of vomiting this morning around 6am. Denies regular contractions, vaginal bleeding, leaking fluid, urinary s/s, discharge, itching, or odor. Endorses active fetal movement. ? ?Patient receives prenatal care at [facility] and reports pregnancy has been uncomplicated.  ? ?OB History   ? ? Gravida  ?3  ? Para  ?1  ? Term  ?1  ? Preterm  ?   ? AB  ?1  ? Living  ?1  ?  ? ? SAB  ?1  ? IAB  ?   ? Ectopic  ?   ? Multiple  ?0  ? Live Births  ?1  ?   ?  ?  ? ? ?Past Medical History:  ?Diagnosis Date  ? Anxiety   ? no meds  ? Chlamydia   ? Eczema   ? ? ?Past Surgical History:  ?Procedure Laterality Date  ? NO PAST SURGERIES    ? ? ?Family History  ?Problem Relation Age of Onset  ? Diabetes Mother   ?     Pre diabetes  ? Hypertension Father   ? Stroke Maternal Grandfather   ? Cancer Paternal Grandmother   ?     lung  ? Hearing loss Paternal Grandfather   ?     with age  ? ? ?Social History  ? ?Tobacco Use  ? Smoking status: Never  ? Smokeless tobacco: Never  ?Vaping Use  ? Vaping Use: Never used  ?Substance Use Topics  ? Alcohol use: No  ? Drug use: No  ? ? ?Allergies: No Known Allergies ? ?No medications prior to admission.  ? ?Review of Systems  ?Constitutional:  Positive for fever.  ?HENT:  Positive for sore throat.   ?Respiratory:  Positive for cough and shortness of breath.   ?Cardiovascular: Negative.   ?Gastrointestinal:  Negative.   ?Genitourinary: Negative.   ?Musculoskeletal: Negative.   ?Neurological:  Positive for headaches.  ? ?Physical Exam  ? ?Patient Vitals for the past 24 hrs: ? BP Temp Pulse Resp SpO2 Height Weight  ?05/02/21 0020 -- -- -- -- 97 % -- --  ?05/01/21 2345 106/60 -- (!) 123 -- 96 % -- --  ?05/01/21 2343 108/62 -- (!) 126 20 -- -- --  ?05/01/21 2305 -- -- (!) 117 -- 98 % -- --  ?05/01/21 2245 -- -- (!) 116 -- 97 % -- --  ?05/01/21 2228 -- -- (!) 118 -- 96 % -- --  ?05/01/21 2115 -- -- -- -- 99 % -- --  ?05/01/21 2100 -- -- -- -- 99 % -- --  ?05/01/21 2030 -- -- -- -- 98 % -- --  ?05/01/21 2015 -- -- -- -- 98 % -- --  ?05/01/21 2000 106/63 -- (!) 130 -- 99 % -- --  ?05/01/21 1938 (!) 109/59 99.7 ?F (37.6 ?C) (!) 130 (!) 24 -- 5\' 2"  (  1.575 m) 79.2 kg  ?05/01/21 1937 -- -- -- -- 97 % -- --  ? ?Physical Exam ?Vitals and nursing note reviewed.  ?Constitutional:   ?   General: She is not in acute distress. ?Cardiovascular:  ?   Rate and Rhythm: Regular rhythm. Tachycardia present.  ?Pulmonary:  ?   Effort: Pulmonary effort is normal. Tachypnea present. No respiratory distress.  ?   Breath sounds: Normal breath sounds. No wheezing, rhonchi or rales.  ?Abdominal:  ?   Palpations: Abdomen is soft.  ?   Tenderness: There is no abdominal tenderness.  ?   Comments: gravid  ?Genitourinary: ?   Comments: VE: 3.5/80/-3 ?Musculoskeletal:     ?   General: Normal range of motion.  ?   Cervical back: Normal range of motion.  ?Skin: ?   General: Skin is warm and dry.  ?Neurological:  ?   General: No focal deficit present.  ?   Mental Status: She is alert and oriented to person, place, and time.  ?Psychiatric:     ?   Mood and Affect: Mood normal.     ?   Behavior: Behavior normal.  ? ?NST ?FHR: 150 bpm, moderate variability. +15x15 accels, no decels ?Toco: irregular ? ?DG Chest Portable 1 View ? ?Result Date: 05/01/2021 ?CLINICAL DATA:  Pregnancy with fever and shortness of breath. EXAM: PORTABLE CHEST 1 VIEW COMPARISON:   08/20/2016. FINDINGS: The heart is enlarged and the mediastinal contour is within normal limits. No consolidation, effusion, or pneumothorax. No acute osseous abnormality. IMPRESSION: Cardiomegaly with no acute disease process. Electronically Signed   By: Thornell Sartorius M.D.   On: 05/01/2021 20:44   ? ?MAU Course  ?Procedures ?NST ?IVF bolus ?CBC, CMP ?Flu/Covid swab ?UA ?Tylenol 1000mg   ?Chest x-ray ?Continuous pulse ox ?EKG ?Fereheme ? ?MDM ?UA unremarkable. IVF bolus and Tylenol 1000mg  PO given. Hgb noted to be 7.9, other labs reassuring. Patient reports she is suppose to be taking oral iron supplement, however hasn't taken since early December. Covid positive, flu negative. Patient initially tachycardic into 130's on arrival, however improvement after IVF bolus. HR stable in 110s. O2 consistently above 96% on room air without evidence of respiratory distress. Patient is able to ambulate around unit without respiratory distress. Cardiology paged numerous times in regards to EKG, however unable to reach. I discussed patient presentation and EKG with Dr. - EKG has some artifact, however what is shown is not concerning for ACS. Chest x-ray without infectious process, however cardiomegaly noted, likely related to pregnancy, however can consider outpatient cardiology referral. Patient was given a dose of Fereheme for Hgb 7.9.   ?NST reactive and reassuring for gestational age. Toco with irregular contractions. Cervix 3.5/80/-3, unchanged from previous office visit. Patient reports feeling better after fluids, fereheme, and tylenol. Paxlovid was discussed with patient and she would like to have rx sent to pharmacy. At this time, I feel patient is stable for discharge home. No evidence of ACS or respiratory distress. She is not in active labor. She has an appointment scheduled with her OBGYN tomorrow. I will reach out to patient's OBGYN to change appointment to virtual visit.   ? ?Assessment and Plan  ?[redacted] weeks  gestation of pregnancy ?Covid affecting pregnancy ?Anemia ? ?- Discharge home in stable condition ?- Rx for Paxlovid sent to pharmacy ?- Strict return precautions reviewed at length. Return to MAU sooner or as needed for worsening symptoms ?- Patient to call OBGYN office first thing in the morning to  change appt to virtual visit ? ? ? ?Brand Males, CNM ?05/02/2021, 12:39 AM  ?

## 2021-05-02 DIAGNOSIS — O98513 Other viral diseases complicating pregnancy, third trimester: Secondary | ICD-10-CM

## 2021-05-02 DIAGNOSIS — U071 COVID-19: Secondary | ICD-10-CM

## 2021-05-02 DIAGNOSIS — O99013 Anemia complicating pregnancy, third trimester: Secondary | ICD-10-CM

## 2021-05-02 DIAGNOSIS — Z3A38 38 weeks gestation of pregnancy: Secondary | ICD-10-CM

## 2021-05-02 MED ORDER — NIRMATRELVIR/RITONAVIR (PAXLOVID)TABLET: 3.0000 | ORAL_TABLET | Freq: Two times a day (BID) | ORAL | 0 refills | Status: AC

## 2021-05-12 ENCOUNTER — Encounter (HOSPITAL_COMMUNITY)
Admission: AD | Disposition: A | Discharge status: Home / Self Care | Payer: Self-pay | Source: Home / Self Care | Attending: Obstetrics and Gynecology

## 2021-05-12 ENCOUNTER — Inpatient Hospital Stay (HOSPITAL_COMMUNITY): Payer: BC Managed Care – PPO | Admitting: Anesthesiology

## 2021-05-12 ENCOUNTER — Inpatient Hospital Stay (HOSPITAL_COMMUNITY)
Admission: AD | Admit: 2021-05-12 | Discharge: 2021-05-15 | DRG: 787 | Disposition: A | Discharge status: Home / Self Care | Payer: BC Managed Care – PPO | Attending: Obstetrics and Gynecology | Admitting: Obstetrics and Gynecology

## 2021-05-12 ENCOUNTER — Other Ambulatory Visit: Payer: Self-pay

## 2021-05-12 ENCOUNTER — Encounter (HOSPITAL_COMMUNITY): Payer: Self-pay | Admitting: Obstetrics and Gynecology

## 2021-05-12 DIAGNOSIS — O9081 Anemia of the puerperium: Secondary | ICD-10-CM | POA: Diagnosis not present

## 2021-05-12 DIAGNOSIS — Z98891 History of uterine scar from previous surgery: Secondary | ICD-10-CM

## 2021-05-12 DIAGNOSIS — O99213 Obesity complicating pregnancy, third trimester: Secondary | ICD-10-CM | POA: Diagnosis not present

## 2021-05-12 DIAGNOSIS — D62 Acute posthemorrhagic anemia: Secondary | ICD-10-CM | POA: Diagnosis not present

## 2021-05-12 DIAGNOSIS — O26893 Other specified pregnancy related conditions, third trimester: Secondary | ICD-10-CM | POA: Diagnosis not present

## 2021-05-12 DIAGNOSIS — O99214 Obesity complicating childbirth: Secondary | ICD-10-CM | POA: Diagnosis not present

## 2021-05-12 DIAGNOSIS — Z3A39 39 weeks gestation of pregnancy: Secondary | ICD-10-CM | POA: Diagnosis not present

## 2021-05-12 LAB — CBC
HCT: 29.7 % — ABNORMAL LOW (ref 36.0–46.0)
Hemoglobin: 9.3 g/dL — ABNORMAL LOW (ref 12.0–15.0)
MCH: 29.1 pg (ref 26.0–34.0)
MCHC: 31.3 g/dL (ref 30.0–36.0)
MCV: 92.8 fL (ref 80.0–100.0)
Platelets: 228 10*3/uL (ref 150–400)
RBC: 3.2 MIL/uL — ABNORMAL LOW (ref 3.87–5.11)
RDW: 19.3 % — ABNORMAL HIGH (ref 11.5–15.5)
WBC: 7 10*3/uL (ref 4.0–10.5)
nRBC: 0.7 % — ABNORMAL HIGH (ref 0.0–0.2)

## 2021-05-12 LAB — RPR: RPR Ser Ql: NONREACTIVE

## 2021-05-12 SURGERY — Surgical Case
Anesthesia: Epidural

## 2021-05-12 MED ORDER — ACETAMINOPHEN 325 MG PO TABS: 650.0000 mg | ORAL_TABLET | ORAL | Status: DC | PRN

## 2021-05-12 MED ORDER — DEXAMETHASONE SODIUM PHOSPHATE 4 MG/ML IJ SOLN
INTRAMUSCULAR | Status: AC
  Filled 2021-05-12: qty 1

## 2021-05-12 MED ORDER — PHENYLEPHRINE 40 MCG/ML (10ML) SYRINGE FOR IV PUSH (FOR BLOOD PRESSURE SUPPORT): 80.0000 ug | PREFILLED_SYRINGE | INTRAVENOUS | Status: DC | PRN

## 2021-05-12 MED ORDER — DIPHENHYDRAMINE HCL 25 MG PO CAPS
25.0000 mg | ORAL_CAPSULE | Freq: Four times a day (QID) | ORAL | Status: DC | PRN
Start: 2021-05-12 — End: 2021-05-15

## 2021-05-12 MED ORDER — NALOXONE HCL 0.4 MG/ML IJ SOLN: 0.4000 mg | INTRAMUSCULAR | Status: DC | PRN

## 2021-05-12 MED ORDER — ACETAMINOPHEN 10 MG/ML IV SOLN
INTRAVENOUS | Status: DC | PRN
  Administered 2021-05-12: 1000 mg via INTRAVENOUS

## 2021-05-12 MED ORDER — ZOLPIDEM TARTRATE 5 MG PO TABS: 5.0000 mg | ORAL_TABLET | Freq: Every evening | ORAL | Status: DC | PRN

## 2021-05-12 MED ORDER — SIMETHICONE 80 MG PO CHEW: 80.0000 mg | CHEWABLE_TABLET | ORAL | Status: DC | PRN

## 2021-05-12 MED ORDER — ONDANSETRON HCL 4 MG/2ML IJ SOLN: 4.0000 mg | Freq: Four times a day (QID) | INTRAMUSCULAR | Status: DC | PRN

## 2021-05-12 MED ORDER — OXYTOCIN-SODIUM CHLORIDE 30-0.9 UT/500ML-% IV SOLN
1.0000 m[IU]/min | INTRAVENOUS | Status: DC
  Administered 2021-05-12: 2 m[IU]/min via INTRAVENOUS
  Filled 2021-05-12: qty 500

## 2021-05-12 MED ORDER — LACTATED RINGERS IV SOLN
500.0000 mL | Freq: Once | INTRAVENOUS | Status: AC
  Administered 2021-05-12: 500 mL via INTRAVENOUS

## 2021-05-12 MED ORDER — MENTHOL 3 MG MT LOZG: 1.0000 | LOZENGE | OROMUCOSAL | Status: DC | PRN

## 2021-05-12 MED ORDER — OXYCODONE HCL 5 MG PO TABS
5.0000 mg | ORAL_TABLET | ORAL | Status: DC | PRN
  Administered 2021-05-14: 5 mg via ORAL
  Administered 2021-05-14: 10 mg via ORAL
  Filled 2021-05-12: qty 1
  Filled 2021-05-12: qty 2

## 2021-05-12 MED ORDER — SODIUM CHLORIDE 0.9% FLUSH: 3.0000 mL | INTRAVENOUS | Status: DC | PRN

## 2021-05-12 MED ORDER — TRANEXAMIC ACID-NACL 1000-0.7 MG/100ML-% IV SOLN
INTRAVENOUS | Status: AC
  Filled 2021-05-12: qty 100

## 2021-05-12 MED ORDER — EPHEDRINE 5 MG/ML INJ: 10.0000 mg | INTRAVENOUS | Status: DC | PRN

## 2021-05-12 MED ORDER — PHENYLEPHRINE 40 MCG/ML (10ML) SYRINGE FOR IV PUSH (FOR BLOOD PRESSURE SUPPORT)
PREFILLED_SYRINGE | INTRAVENOUS | Status: DC | PRN
Start: 2021-05-12 — End: 2021-05-12
  Administered 2021-05-12 (×3): 80 ug via INTRAVENOUS

## 2021-05-12 MED ORDER — IBUPROFEN 600 MG PO TABS
600.0000 mg | ORAL_TABLET | Freq: Four times a day (QID) | ORAL | Status: DC
  Administered 2021-05-13 – 2021-05-14 (×2): 600 mg via ORAL
  Filled 2021-05-12 (×2): qty 1

## 2021-05-12 MED ORDER — DIPHENHYDRAMINE HCL 25 MG PO CAPS: 25.0000 mg | ORAL_CAPSULE | ORAL | Status: DC | PRN

## 2021-05-12 MED ORDER — METOCLOPRAMIDE HCL 5 MG/ML IJ SOLN
INTRAMUSCULAR | Status: DC | PRN
  Administered 2021-05-12: 10 mg via INTRAVENOUS

## 2021-05-12 MED ORDER — OXYTOCIN-SODIUM CHLORIDE 30-0.9 UT/500ML-% IV SOLN
2.5000 [IU]/h | INTRAVENOUS | Status: AC
  Administered 2021-05-12: 2.5 [IU]/h via INTRAVENOUS

## 2021-05-12 MED ORDER — SENNOSIDES-DOCUSATE SODIUM 8.6-50 MG PO TABS
2.0000 | ORAL_TABLET | Freq: Every day | ORAL | Status: DC
  Administered 2021-05-13 – 2021-05-14 (×2): 2 via ORAL
  Filled 2021-05-12 (×3): qty 2

## 2021-05-12 MED ORDER — OXYTOCIN BOLUS FROM INFUSION: 333.0000 mL | Freq: Once | INTRAVENOUS | Status: DC

## 2021-05-12 MED ORDER — OXYCODONE-ACETAMINOPHEN 5-325 MG PO TABS: 1.0000 | ORAL_TABLET | ORAL | Status: DC | PRN

## 2021-05-12 MED ORDER — LIDOCAINE-EPINEPHRINE (PF) 2 %-1:200000 IJ SOLN
INTRAMUSCULAR | Status: DC | PRN
  Administered 2021-05-12: 10 mL via EPIDURAL
  Administered 2021-05-12 (×2): 5 mL via EPIDURAL

## 2021-05-12 MED ORDER — NALOXONE HCL 4 MG/10ML IJ SOLN
1.0000 ug/kg/h | INTRAVENOUS | Status: DC | PRN
  Filled 2021-05-12: qty 5

## 2021-05-12 MED ORDER — TRANEXAMIC ACID-NACL 1000-0.7 MG/100ML-% IV SOLN
INTRAVENOUS | Status: DC | PRN
  Administered 2021-05-12: 1000 mg via INTRAVENOUS

## 2021-05-12 MED ORDER — METHYLERGONOVINE MALEATE 0.2 MG/ML IJ SOLN
INTRAMUSCULAR | Status: DC | PRN
  Administered 2021-05-12: .2 mg via INTRAMUSCULAR

## 2021-05-12 MED ORDER — WITCH HAZEL-GLYCERIN EX PADS: 1.0000 "application " | MEDICATED_PAD | CUTANEOUS | Status: DC | PRN

## 2021-05-12 MED ORDER — PHENYLEPHRINE 40 MCG/ML (10ML) SYRINGE FOR IV PUSH (FOR BLOOD PRESSURE SUPPORT)
PREFILLED_SYRINGE | INTRAVENOUS | Status: AC
  Filled 2021-05-12: qty 10

## 2021-05-12 MED ORDER — LIDOCAINE HCL (PF) 1 % IJ SOLN: 30.0000 mL | INTRAMUSCULAR | Status: DC | PRN

## 2021-05-12 MED ORDER — FENTANYL-BUPIVACAINE-NACL 0.5-0.125-0.9 MG/250ML-% EP SOLN
12.0000 mL/h | EPIDURAL | Status: DC | PRN
  Administered 2021-05-12: 12 mL/h via EPIDURAL
  Filled 2021-05-12: qty 250

## 2021-05-12 MED ORDER — DEXAMETHASONE SODIUM PHOSPHATE 10 MG/ML IJ SOLN
INTRAMUSCULAR | Status: DC | PRN
  Administered 2021-05-12: 10 mg via INTRAVENOUS

## 2021-05-12 MED ORDER — FENTANYL CITRATE (PF) 100 MCG/2ML IJ SOLN
INTRAMUSCULAR | Status: AC
  Filled 2021-05-12: qty 2

## 2021-05-12 MED ORDER — FENTANYL CITRATE (PF) 100 MCG/2ML IJ SOLN: 50.0000 ug | INTRAMUSCULAR | Status: DC | PRN

## 2021-05-12 MED ORDER — LACTATED RINGERS IV SOLN: 500.0000 mL | INTRAVENOUS | Status: DC | PRN

## 2021-05-12 MED ORDER — OXYTOCIN-SODIUM CHLORIDE 30-0.9 UT/500ML-% IV SOLN
INTRAVENOUS | Status: DC | PRN
  Administered 2021-05-12 (×2): 100 mL via INTRAVENOUS
  Administered 2021-05-12: 300 mL via INTRAVENOUS

## 2021-05-12 MED ORDER — CEFAZOLIN SODIUM-DEXTROSE 2-4 GM/100ML-% IV SOLN
INTRAVENOUS | Status: AC
  Filled 2021-05-12: qty 100

## 2021-05-12 MED ORDER — CEFAZOLIN SODIUM-DEXTROSE 2-3 GM-%(50ML) IV SOLR
INTRAVENOUS | Status: DC | PRN
  Administered 2021-05-12: 2 g via INTRAVENOUS

## 2021-05-12 MED ORDER — MORPHINE SULFATE (PF) 2 MG/ML IV SOLN: 1.0000 mg | INTRAVENOUS | Status: DC | PRN

## 2021-05-12 MED ORDER — TERBUTALINE SULFATE 1 MG/ML IJ SOLN: 0.2500 mg | Freq: Once | INTRAMUSCULAR | Status: DC | PRN

## 2021-05-12 MED ORDER — LACTATED RINGERS IV SOLN: INTRAVENOUS | Status: DC

## 2021-05-12 MED ORDER — KETOROLAC TROMETHAMINE 30 MG/ML IJ SOLN: 30.0000 mg | Freq: Four times a day (QID) | INTRAMUSCULAR | Status: DC | PRN

## 2021-05-12 MED ORDER — SIMETHICONE 80 MG PO CHEW
80.0000 mg | CHEWABLE_TABLET | Freq: Three times a day (TID) | ORAL | Status: DC
  Administered 2021-05-13 – 2021-05-15 (×7): 80 mg via ORAL
  Filled 2021-05-12 (×8): qty 1

## 2021-05-12 MED ORDER — MORPHINE SULFATE (PF) 0.5 MG/ML IJ SOLN
INTRAMUSCULAR | Status: DC | PRN
Start: 2021-05-12 — End: 2021-05-12
  Administered 2021-05-12: 3 mg via EPIDURAL

## 2021-05-12 MED ORDER — FENTANYL CITRATE (PF) 100 MCG/2ML IJ SOLN
INTRAMUSCULAR | Status: DC | PRN
  Administered 2021-05-12: 100 ug via INTRAVENOUS

## 2021-05-12 MED ORDER — SODIUM CHLORIDE 0.9 % IV SOLN
12.5000 mg | Freq: Once | INTRAVENOUS | Status: DC
  Filled 2021-05-12: qty 0.5

## 2021-05-12 MED ORDER — PRENATAL MULTIVITAMIN CH
1.0000 | ORAL_TABLET | Freq: Every day | ORAL | Status: DC
  Administered 2021-05-13: 1 via ORAL
  Filled 2021-05-12: qty 1

## 2021-05-12 MED ORDER — ACETAMINOPHEN 10 MG/ML IV SOLN
INTRAVENOUS | Status: AC
  Filled 2021-05-12: qty 100

## 2021-05-12 MED ORDER — TRANEXAMIC ACID-NACL 1000-0.7 MG/100ML-% IV SOLN
1000.0000 mg | Freq: Once | INTRAVENOUS | Status: AC
  Administered 2021-05-12: 1000 mg via INTRAVENOUS

## 2021-05-12 MED ORDER — DIPHENHYDRAMINE HCL 50 MG/ML IJ SOLN
INTRAMUSCULAR | Status: DC | PRN
  Administered 2021-05-12: 25 mg via INTRAVENOUS

## 2021-05-12 MED ORDER — OXYTOCIN-SODIUM CHLORIDE 30-0.9 UT/500ML-% IV SOLN: 2.5000 [IU]/h | INTRAVENOUS | Status: DC

## 2021-05-12 MED ORDER — DIPHENHYDRAMINE HCL 50 MG/ML IJ SOLN: 12.5000 mg | INTRAMUSCULAR | Status: DC | PRN

## 2021-05-12 MED ORDER — PROPOFOL 10 MG/ML IV BOLUS
INTRAVENOUS | Status: AC
  Filled 2021-05-12: qty 20

## 2021-05-12 MED ORDER — METHYLERGONOVINE MALEATE 0.2 MG/ML IJ SOLN
INTRAMUSCULAR | Status: AC
  Filled 2021-05-12: qty 1

## 2021-05-12 MED ORDER — OXYCODONE-ACETAMINOPHEN 5-325 MG PO TABS: 2.0000 | ORAL_TABLET | ORAL | Status: DC | PRN

## 2021-05-12 MED ORDER — COCONUT OIL OIL: 1.0000 "application " | TOPICAL_OIL | Status: DC | PRN

## 2021-05-12 MED ORDER — SOD CITRATE-CITRIC ACID 500-334 MG/5ML PO SOLN: 30.0000 mL | ORAL | Status: DC | PRN

## 2021-05-12 MED ORDER — FENTANYL CITRATE (PF) 100 MCG/2ML IJ SOLN
INTRAMUSCULAR | Status: DC | PRN
  Administered 2021-05-12: 100 ug via EPIDURAL

## 2021-05-12 MED ORDER — ONDANSETRON HCL 4 MG/2ML IJ SOLN
INTRAMUSCULAR | Status: DC | PRN
  Administered 2021-05-12: 4 mg via INTRAVENOUS

## 2021-05-12 MED ORDER — KETOROLAC TROMETHAMINE 30 MG/ML IJ SOLN
30.0000 mg | Freq: Four times a day (QID) | INTRAMUSCULAR | Status: AC
  Administered 2021-05-13 (×2): 30 mg via INTRAVENOUS
  Filled 2021-05-12 (×3): qty 1

## 2021-05-12 MED ORDER — DIPHENHYDRAMINE HCL 50 MG/ML IJ SOLN
12.5000 mg | INTRAMUSCULAR | Status: DC | PRN
  Administered 2021-05-13: 12.5 mg via INTRAVENOUS
  Filled 2021-05-12 (×2): qty 1

## 2021-05-12 MED ORDER — ONDANSETRON HCL 4 MG/2ML IJ SOLN
4.0000 mg | Freq: Three times a day (TID) | INTRAMUSCULAR | Status: DC | PRN
  Administered 2021-05-13: 4 mg via INTRAVENOUS
  Filled 2021-05-12: qty 2

## 2021-05-12 MED ORDER — DIBUCAINE (PERIANAL) 1 % EX OINT: 1.0000 "application " | TOPICAL_OINTMENT | CUTANEOUS | Status: DC | PRN

## 2021-05-12 MED ORDER — DEXMEDETOMIDINE (PRECEDEX) IN NS 20 MCG/5ML (4 MCG/ML) IV SYRINGE
PREFILLED_SYRINGE | INTRAVENOUS | Status: AC
  Filled 2021-05-12: qty 5

## 2021-05-12 MED ORDER — FENTANYL CITRATE (PF) 100 MCG/2ML IJ SOLN: 25.0000 ug | INTRAMUSCULAR | Status: DC | PRN

## 2021-05-12 MED ORDER — MEPERIDINE HCL 25 MG/ML IJ SOLN: 6.2500 mg | INTRAMUSCULAR | Status: DC | PRN

## 2021-05-12 MED ORDER — ACETAMINOPHEN 500 MG PO TABS
1000.0000 mg | ORAL_TABLET | Freq: Four times a day (QID) | ORAL | Status: DC
  Administered 2021-05-13 – 2021-05-14 (×4): 1000 mg via ORAL
  Filled 2021-05-12 (×5): qty 2

## 2021-05-12 MED ORDER — HYDROXYZINE HCL 50 MG PO TABS: 50.0000 mg | ORAL_TABLET | Freq: Four times a day (QID) | ORAL | Status: DC | PRN

## 2021-05-12 MED ORDER — SCOPOLAMINE 1 MG/3DAYS TD PT72
MEDICATED_PATCH | TRANSDERMAL | Status: DC | PRN
  Administered 2021-05-12: 1 via TRANSDERMAL

## 2021-05-12 SURGICAL SUPPLY — 38 items
BENZOIN TINCTURE PRP APPL 2/3 (GAUZE/BANDAGES/DRESSINGS) ×2 IMPLANT
CHLORAPREP W/TINT 26 (MISCELLANEOUS) ×2 IMPLANT
CLAMP CORD UMBIL (MISCELLANEOUS) IMPLANT
CLOSURE STERI STRIP 1/2 X4 (GAUZE/BANDAGES/DRESSINGS) ×1 IMPLANT
CLOTH BEACON ORANGE TIMEOUT ST (SAFETY) ×2 IMPLANT
DRAPE C SECTION CLR SCREEN (DRAPES) ×2 IMPLANT
DRSG OPSITE POSTOP 4X10 (GAUZE/BANDAGES/DRESSINGS) ×2 IMPLANT
ELECT REM PT RETURN 9FT ADLT (ELECTROSURGICAL) ×2
ELECTRODE REM PT RTRN 9FT ADLT (ELECTROSURGICAL) ×1 IMPLANT
EXTRACTOR VACUUM KIWI (MISCELLANEOUS) IMPLANT
GLOVE BIOGEL PI IND STRL 7.0 (GLOVE) ×2 IMPLANT
GLOVE BIOGEL PI INDICATOR 7.0 (GLOVE) ×2
GLOVE SURG ENC MOIS LTX SZ6.5 (GLOVE) ×2 IMPLANT
GLOVE SURG POLYISO LF SZ6.5 (GLOVE) ×2 IMPLANT
GOWN STRL REUS W/ TWL LRG LVL3 (GOWN DISPOSABLE) ×3 IMPLANT
GOWN STRL REUS W/TWL LRG LVL3 (GOWN DISPOSABLE) ×3
KIT ABG SYR 3ML LUER SLIP (SYRINGE) IMPLANT
NDL HYPO 25X5/8 SAFETYGLIDE (NEEDLE) IMPLANT
NEEDLE HYPO 25X5/8 SAFETYGLIDE (NEEDLE) IMPLANT
NS IRRIG 1000ML POUR BTL (IV SOLUTION) ×2 IMPLANT
PACK C SECTION WH (CUSTOM PROCEDURE TRAY) ×2 IMPLANT
PAD ABD DERMACEA PRESS 5X9 (GAUZE/BANDAGES/DRESSINGS) ×1 IMPLANT
PAD OB MATERNITY 4.3X12.25 (PERSONAL CARE ITEMS) ×2 IMPLANT
PENCIL SMOKE EVAC W/HOLSTER (ELECTROSURGICAL) ×2 IMPLANT
RTRCTR C-SECT PINK 25CM LRG (MISCELLANEOUS) ×2 IMPLANT
STRIP CLOSURE SKIN 1/2X4 (GAUZE/BANDAGES/DRESSINGS) ×2 IMPLANT
SUT MNCRL 0 VIOLET CTX 36 (SUTURE) ×2 IMPLANT
SUT MNCRL+ AB 3-0 CT1 36 (SUTURE) ×2 IMPLANT
SUT MONOCRYL 0 CTX 36 (SUTURE) ×2
SUT MONOCRYL AB 3-0 CT1 36IN (SUTURE) ×2
SUT PDS AB 0 CTX 36 PDP370T (SUTURE) ×4 IMPLANT
SUT PLAIN 0 NONE (SUTURE) IMPLANT
SUT VIC AB 2-0 CT1 27 (SUTURE)
SUT VIC AB 2-0 CT1 TAPERPNT 27 (SUTURE) IMPLANT
SUT VIC AB 4-0 KS 27 (SUTURE) ×2 IMPLANT
TOWEL OR 17X24 6PK STRL BLUE (TOWEL DISPOSABLE) ×4 IMPLANT
TRAY FOLEY W/BAG SLVR 14FR LF (SET/KITS/TRAYS/PACK) ×2 IMPLANT
WATER STERILE IRR 1000ML POUR (IV SOLUTION) ×2 IMPLANT

## 2021-05-12 NOTE — Anesthesia Postprocedure Evaluation (Signed)
Anesthesia Post Note ? ?Patient: Chelsea Small ? ?Procedure(s) Performed: CESAREAN SECTION ? ?  ? ?Patient location during evaluation: PACU ?Anesthesia Type: Epidural ?Level of consciousness: oriented and awake and alert ?Pain management: pain level controlled ?Vital Signs Assessment: post-procedure vital signs reviewed and stable ?Respiratory status: spontaneous breathing, respiratory function stable and nonlabored ventilation ?Cardiovascular status: blood pressure returned to baseline and stable ?Postop Assessment: no headache, no backache, no apparent nausea or vomiting, epidural receding and patient able to bend at knees ?Anesthetic complications: no ? ? ?No notable events documented. ? ?Last Vitals:  ?Vitals:  ? 05/12/21 2013 05/12/21 2014  ?BP:    ?Pulse: (!) 101 100  ?Resp: (!) 21 (!) 26  ?Temp:    ?SpO2:    ?  ?Last Pain:  ?Vitals:  ? 05/12/21 2010  ?TempSrc:   ?PainSc: 0-No pain  ? ?Pain Goal:   ? ?LLE Motor Response: Purposeful movement (05/12/21 1945) ?LLE Sensation: Tingling (05/12/21 1945) ?RLE Motor Response: Purposeful movement (05/12/21 1945) ?RLE Sensation: Tingling (05/12/21 1945) ?  ?  ?Epidural/Spinal Function Cutaneous sensation: Tingles (05/12/21 1945), Patient able to flex knees: No (05/12/21 1945), Patient able to lift hips off bed: No (05/12/21 1945), Back pain beyond tenderness at insertion site: No (05/12/21 1945), Progressively worsening motor and/or sensory loss: No (05/12/21 1945), Bowel and/or bladder incontinence post epidural: No (05/12/21 1945) ? ?Laporchia Nakajima A. ? ? ? ? ?

## 2021-05-12 NOTE — Progress Notes (Signed)
OB Progress Note ? ?S: Patient resting comfortably with epidural. Consents to AROM. ? ?O: BP 99/60   Pulse 90   Temp 98.9 ?F (37.2 ?C) (Oral)   Resp 16   Ht 5\' 2"  (1.575 m)   Wt 81.3 kg   LMP 03/02/2020 (Exact Date)   SpO2 99%   BMI 32.78 kg/m?  ? ?FHT: 135bpm, moderate variablity, + accels, - decels ?Toco: q2-3 minutes ?SVE: 6.5/100/-2, sutures palpated ?AROM: Clear, copious, non-odorous ? ?A/P: 31 y.o. G3P1011 @ [redacted]w[redacted]d admitted for induction/augmentation of labor for favorable cervix at term. ? ?FWB: Cat. I  ?Labor course: Pitocin at 44mU/min, AROM now- clear ?Pain: Epidural ?GBS: Negative ?Anticipate SVD ? ?Drema Dallas, DO ? ? ? ? ?

## 2021-05-12 NOTE — Progress Notes (Signed)
LATE ENTRY ? ?Called back to bedside by primary RN for fetal heart rate deceleration in the 90s. Cervical exam revealed prolapsed cord through 6.5cm cervix. Emergent C/S called - OR staff and anesthesia made aware. Patient emergently consented for CS - counseled on risk of bleeding, infection, damage to surround organs, blood loss, and need for blood transfusion. ? ?Steva Ready, DO ? ?

## 2021-05-12 NOTE — Anesthesia Preprocedure Evaluation (Signed)
Anesthesia Evaluation  ?Patient identified by MRN, date of birth, ID band ?Patient awake ? ? ? ?Reviewed: ?Allergy & Precautions, NPO status , Patient's Chart, lab work & pertinent test results ? ?Airway ?Mallampati: II ? ?TM Distance: >3 FB ?Neck ROM: Full ? ? ? Dental ?no notable dental hx. ? ?  ?Pulmonary ?neg pulmonary ROS,  ?  ?Pulmonary exam normal ?breath sounds clear to auscultation ? ? ? ? ? ? Cardiovascular ?negative cardio ROS ?Normal cardiovascular exam ?Rhythm:Regular Rate:Normal ? ? ?  ?Neuro/Psych ?PSYCHIATRIC DISORDERS Anxiety negative neurological ROS ?   ? GI/Hepatic ?negative GI ROS, Neg liver ROS,   ?Endo/Other  ?negative endocrine ROS ? Renal/GU ?negative Renal ROS  ?negative genitourinary ?  ?Musculoskeletal ?negative musculoskeletal ROS ?(+)  ? Abdominal ?  ?Peds ? Hematology ? ?(+) Blood dyscrasia, anemia , Lab Results ?     Component                Value               Date                 ?     WBC                      7.0                 05/12/2021           ?     HGB                      9.3 (L)             05/12/2021           ?     HCT                      29.7 (L)            05/12/2021           ?     MCV                      92.8                05/12/2021           ?     PLT                      228                 05/12/2021           ?   ?Anesthesia Other Findings ? ? Reproductive/Obstetrics ?(+) Pregnancy ? ?  ? ? ? ? ? ? ? ? ? ? ? ? ? ?  ?  ? ? ? ? ? ? ? ? ?Anesthesia Physical ?Anesthesia Plan ? ?ASA: 2 ? ?Anesthesia Plan: Epidural  ? ?Post-op Pain Management:   ? ?Induction:  ? ?PONV Risk Score and Plan: Treatment may vary due to age or medical condition ? ?Airway Management Planned: Natural Airway ? ?Additional Equipment:  ? ?Intra-op Plan:  ? ?Post-operative Plan:  ? ?Informed Consent: I have reviewed the patients History and Physical, chart, labs and discussed the procedure including the risks, benefits and alternatives for the proposed  anesthesia with the patient or authorized representative who has indicated his/her understanding and acceptance.  ? ? ? ? ? ?  Plan Discussed with: Anesthesiologist ? ?Anesthesia Plan Comments: (Patient identified. Risks, benefits, options discussed with patient including but not limited to bleeding, infection, nerve damage, paralysis, failed block, incomplete pain control, headache, blood pressure changes, nausea, vomiting, reactions to medication, itching, and post partum back pain. Confirmed with bedside nurse the patient's most recent platelet count. Confirmed with the patient that they are not taking any anticoagulation, have any bleeding history or any family history of bleeding disorders. Patient expressed understanding and wishes to proceed. All questions were answered. )  ? ? ? ? ? ? ?Anesthesia Quick Evaluation ? ?

## 2021-05-12 NOTE — Op Note (Signed)
Pre Op Dx:   ?1. Single live IUP at [redacted]w[redacted]d ?2. Cord prolapse ?3. Fetal bradycardia ? ?Post Op Dx:  ?Same as pre-operative diagnoses ? ?Procedure:  Low Transverse Cesarean Section ? ?Surgeon:  Dr. Katrinka Blazing. Connye Burkitt ?Assistants:  Dr. Merian Capron ?Anesthesia:  Epidural ? ?EBL:  1187cc  ?IVF:  See anesthesia documentation ?UOP:  400cc ? ?Drains:  Foley catheter  ?Specimen removed:  Placenta, umbilical cord gases ?Device(s) implanted:  None ?Case Type:  Clean-contaminated ?Findings: Normal-appearing uterus, bilateral fallopian tubes, and ovaries. Fetus in cephalic position. Significant cord delivered out of hysterotomy first. Clear amniotic fluid. APGARS 8/9. Infant weight: 4190g (9lbs 3.8oz). ?Complications: None ? ?Indications:  31 y.o. G3P1011 undergoing induction of labor for favorable cervix at term. Fetal heart tracing with fetal bradycardia noted and on cervical exam prolapsed cord was noted. ? ?Procedure:  After informed consent was obtained, the patient was brought to the operating room with primary RN lifting fetal head from below.  Following confirmation of anesthesia, the patient was positioned in dorsal supine position with a leftward tilt and was prepped and draped in sterile fashion. A pre-operative time out was NOT performed due to the emergent nature of the procedure. The abdomen was entered in layers through a pfannenstiel incision and a retractor was placed.  A low transverse hysterotomy was created sharply to the level of the membranes, then extended bluntly.  The fetus was delivered from cephalic presentation onto the field with aid of Kiwi vacuum due to difficulty delivery fetal head. It was noted that the vacuum was placed off center toward the right parietal bone. Bulb suctioning was performed.  The cord was doubly clamped and cut after a 60 second pause.  The newborn was passed to the awaiting NICU team.  The placenta was delivered.  The uterus was swept free of clots and debris and closed in  a running locked fashion with 0-Monocryl. The right uterine artery was lacerated and was ligated using two interrupted sutures using 0-Monocryl. A second imbricating layer was used to close the uterus using 0-Monocryl. Additional hemostasis obtained with figure-of-eight sutures on the right of the hysterotomy.  Hemostasis was verified.  The abdomen was irrigated with warmed saline and cleared of clots.  The peritoneum was closed in a running fashion with 2-0 Vicryl.  Subfascial spaces were inspected and hemostasis assured.  The fascia was closed in a running fashion with 0-PDS.  The subcutaneous tissues were irrigated and hemostasis assured.  The skin was closed with 4-0 Vicryl.  A sterile bandage was applied.  The patient was transferred to PACU.  All needle, sponge, and instrument counts were correct at the end of the case.   ? ?Disposition:  PACU ? ?I performed the procedure and the assistant was needed due to the complexity of the anatomy. ? ?Steva Ready, DO ? ?

## 2021-05-12 NOTE — Transfer of Care (Signed)
Immediate Anesthesia Transfer of Care Note ? ?Patient: Chelsea Small ? ?Procedure(s) Performed: CESAREAN SECTION ? ?Patient Location: PACU ? ?Anesthesia Type:Epidural ? ?Level of Consciousness: awake, alert  and oriented ? ?Airway & Oxygen Therapy: Patient Spontanous Breathing ? ?Post-op Assessment: Report given to RN and Post -op Vital signs reviewed and stable ? ?Post vital signs: Reviewed and stable ? ?Last Vitals:  ?Vitals Value Taken Time  ?BP 122/73 05/12/21 1900  ?Temp 36.7 ?C 05/12/21 1850  ?Pulse 99 05/12/21 1902  ?Resp 26 05/12/21 1902  ?SpO2 100 % 05/12/21 1902  ?Vitals shown include unvalidated device data. ? ?Last Pain:  ?Vitals:  ? 05/12/21 1850  ?TempSrc: Oral  ?PainSc: 0-No pain  ?   ? ?  ? ?Complications: No notable events documented. ?

## 2021-05-12 NOTE — H&P (Signed)
HPI: 31 y.o. G3P1011 @ [redacted]w[redacted]d estimated gestational age (as dated by LMP c/w 7 week ultrasound) presents for induction/augmentation of labor favorable cervix at term. ? ?Leakage of fluid:  No ?Vaginal bleeding:  No ?Contractions:  Yes, occasional ?Fetal movement:  Yes ? ?Prenatal care has been provided by Dr. Katrinka Blazing. [facility]) ? ?ROS:  Denies fevers, chills, chest pain, visual changes, SOB, RUQ/epigastric pain, N/V, dysuria, hematuria, or sudden onset/worsening bilateral LE or facial edema. ? ?Pregnancy complicated by: ?Obesity (BMI 31) ?History of LEEP 2021 ?Vaginal lesions (multiple sub-centimeter vaginal sidewall white plaques that appear consistent with leukoplakia, will re-evaluate postpartum) ?Hyperemesis (resolved) ?LSIL pap smear (04/2020) ? ?Prenatal Transfer Tool  ?Maternal Diabetes: No ?Genetic Screening: Normal: Low risk female Panorama, negative carrier for CF/SMA/DMD ?Maternal Ultrasounds/Referrals: Normal ?Fetal Ultrasounds or other Referrals:  None ?Maternal Substance Abuse:  No ?Significant Maternal Medications:  None ?Significant Maternal Lab Results: Group B Strep negative ? ? ?Prenatal Labs ?Blood type:  O Positive ?Antibody screen:  Negative ?CBC:  H/H 9.3/27.6 ?Rubella: Immune ?RPR:  Non-reactive ?Hep B:  Negative ?Hep C:  Negative ?HIV:  Negative ?GC/CT:  Negative ?Glucola:  133.0 (wnl) ? ?Immunizations: ?Tdap: Given prenatally ?Flu: Declines ? ?OBHx:  ?OB History   ? ? Gravida  ?3  ? Para  ?1  ? Term  ?1  ? Preterm  ?   ? AB  ?1  ? Living  ?1  ?  ? ? SAB  ?1  ? IAB  ?   ? Ectopic  ?   ? Multiple  ?0  ? Live Births  ?1  ?   ?  ?  ? ?PMHx:  As documented above ?Meds:  PNV ?Allergy:  No Known Allergies ?SurgHx: None ?SocHx:   Denies Tobacco, ETOH, illicit drugs ? ?O: LMP 03/02/2020 (Exact Date)  ?Gen. AAOx3, NAD ?CV.  RRR  ?Resp. CTAB, no wheezes/rales/rhonchi ?Abd. Gravid, soft, non-tender throughout, no rebound/guarding ?Extr.  No bilateral LE edema, no calf tenderness  bilaterally ?SVE: 6/80/-2, sutures palpated  ?SSE: No vulvar/vaginal/cervical lesions.  No blood visualized in vaginal vault.  ? ?Last [place]:  05/12/21 [redacted]w[redacted]d EFW 4040g 8 lbs 15 oz (87%), AAFV, Cephalic, fundal placenta, fetal measurements not accurate due to fetal position ? ?Labs: see orders ? ?A/P:  31 y.o. G3P1011 @ [redacted]w[redacted]d who presents for induction/augmentation of labor for favorable cervix at term. ? ?- Admit to L&D ?- Admit labs (CBC, T&S, COVID screen) ?- CEFM/Toco ?- Diet:  Clear liquids ?- IVF:  LR at 125cc/hour ?- VTE Prophylaxis:  SCDs ?- GBS Status:  Negative ?- Presentation:  Cephalic on [place] ?- Pain control:  Per patient request ?- Induction method:  Pitocin and AROM ?- Anticipate SVD ? ?Steva Ready, DO ?[phone] (office) ? ? ? ? ? ?

## 2021-05-12 NOTE — Anesthesia Procedure Notes (Signed)
Epidural ?Patient location during procedure: OB ?Start time: 05/12/2021 1:40 PM ?End time: 05/12/2021 1:50 PM ? ?Staffing ?Anesthesiologist: Elmer Picker, MD ?Performed: anesthesiologist  ? ?Preanesthetic Checklist ?Completed: patient identified, IV checked, risks and benefits discussed, monitors and equipment checked, pre-op evaluation and timeout performed ? ?Epidural ?Patient position: sitting ?Prep: DuraPrep and site prepped and draped ?Patient monitoring: continuous pulse ox, blood pressure, heart rate and cardiac monitor ?Approach: midline ?Location: L3-L4 ?Injection technique: LOR air ? ?Needle:  ?Needle type: Tuohy  ?Needle gauge: 17 G ?Needle length: 9 cm ?Needle insertion depth: 5 cm ?Catheter type: closed end flexible ?Catheter size: 19 Gauge ?Catheter at skin depth: 10 cm ?Test dose: negative ? ?Assessment ?Sensory level: T8 ?Events: blood not aspirated, injection not painful, no injection resistance, no paresthesia and negative IV test ? ?Additional Notes ?Patient identified. Risks/Benefits/Options discussed with patient including but not limited to bleeding, infection, nerve damage, paralysis, failed block, incomplete pain control, headache, blood pressure changes, nausea, vomiting, reactions to medication both or allergic, itching and postpartum back pain. Confirmed with bedside nurse the patient's most recent platelet count. Confirmed with patient that they are not currently taking any anticoagulation, have any bleeding history or any family history of bleeding disorders. Patient expressed understanding and wished to proceed. All questions were answered. Sterile technique was used throughout the entire procedure. Please see nursing notes for vital signs. Test dose was given through epidural catheter and negative prior to continuing to dose epidural or start infusion. Warning signs of high block given to the patient including shortness of breath, tingling/numbness in hands, complete motor block,  or any concerning symptoms with instructions to call for help. Patient was given instructions on fall risk and not to get out of bed. All questions and concerns addressed with instructions to call with any issues or inadequate analgesia.  Reason for block:procedure for pain ? ? ? ?

## 2021-05-12 NOTE — Lactation Note (Signed)
This note was copied from a baby's chart. ?Lactation Consultation Note ?Mom is going to pump and bottle feed. Mom is giving formula until her milk comes in. RN has taken DEBP kit to rm for LC to set up. Mom told RN she doesn't want pump set up until tomorrow. ? ?Patient Name: Chelsea Small ?Today's Date: 05/12/2021 ?  ?Age:29 hours ? ?Maternal Data ?  ? ?Feeding ?Nipple Type: Slow - flow ? ?LATCH Score ?  ? ?  ? ?  ? ?  ? ?  ? ?  ? ? ?Lactation Tools Discussed/Used ?  ? ?Interventions ?  ? ?Discharge ?  ? ?Consult Status ?  ? ? ? ?Theodoro Kalata ?05/12/2021, 11:51 PM ? ? ? ?

## 2021-05-13 ENCOUNTER — Encounter (HOSPITAL_COMMUNITY): Payer: Self-pay | Admitting: Obstetrics and Gynecology

## 2021-05-13 LAB — CBC
HCT: 20.2 % — ABNORMAL LOW (ref 36.0–46.0)
Hemoglobin: 6.5 g/dL — CL (ref 12.0–15.0)
MCH: 29 pg (ref 26.0–34.0)
MCHC: 32.2 g/dL (ref 30.0–36.0)
MCV: 90.2 fL (ref 80.0–100.0)
Platelets: 206 10*3/uL (ref 150–400)
RBC: 2.24 MIL/uL — ABNORMAL LOW (ref 3.87–5.11)
RDW: 19.1 % — ABNORMAL HIGH (ref 11.5–15.5)
WBC: 14.1 10*3/uL — ABNORMAL HIGH (ref 4.0–10.5)
nRBC: 0.1 % (ref 0.0–0.2)

## 2021-05-13 LAB — PREPARE RBC (CROSSMATCH)

## 2021-05-13 MED ORDER — SODIUM CHLORIDE 0.9 % IV SOLN
500.0000 mg | Freq: Once | INTRAVENOUS | Status: AC
  Administered 2021-05-13: 500 mg via INTRAVENOUS
  Filled 2021-05-13: qty 25

## 2021-05-13 MED ORDER — SODIUM CHLORIDE 0.9% IV SOLUTION: Freq: Once | INTRAVENOUS | Status: DC

## 2021-05-13 NOTE — Lactation Note (Signed)
This note was copied from a baby's chart. ?Lactation Consultation Note ? ?Patient Name: Chelsea Small ?Today's Date: 05/13/2021 ?Reason for consult: Initial assessment;Term ?Age:29 hours ? ?Mom desires to pump & BO. With Mom's permission, I looked at R breast & a size 24 flange would be appropriate (Mom endorses that nipple on L breast is the same as R). Mom brought her Ameda Mya Joy pump and wants to try and use it. Lactation visit was cut short b/c of arrival of visitors. Parents know how to reach me to return.  ?  ?Lactation Tools Discussed/Used ?Tools: Pump;Flanges ?Flange Size: 24 ?Reason for Pumping: maternal desire ?Pumping frequency: has not yet begun pumping ?Pumped volume: 0 mL ? ?Discharge ?Pump: Personal (Mom has an Ameda Mya Joy pump) ? ? ?Lurline Hare [place] ?05/13/2021, 8:12 AM ? ? ? ?

## 2021-05-13 NOTE — Addendum Note (Signed)
Addendum  created 05/13/21 0931 by Algis Greenhouse, CRNA  ? Intraprocedure Event edited  ?  ?

## 2021-05-13 NOTE — Social Work (Signed)
MOB was referred for history of anxiety.  ? ?* Referral screened out by Clinical Social Worker because none of the following criteria appear to apply: ? ?~ History of anxiety/depression during this pregnancy, or of post-partum depression following prior delivery. ?~ Diagnosis of anxiety and/or depression within last 3 years. CSW notes a diagnosis in 2016 or prior.  ?OR ?* MOB's symptoms currently being treated with medication and/or therapy. ? ?Please contact the Clinical Social Worker if needs arise, by [facility] request, or if MOB scores greater than 9/yes to question 10 on Edinburgh Postpartum Depression Screen. ? ?Manfred Arch, LCSW ?Clinical Social Work ?[facility]  ?([phone]  ?

## 2021-05-13 NOTE — Lactation Note (Signed)
This note was copied from a baby's chart. ?Lactation Consultation Note ? ?Patient Name: Chelsea Small ?Today's Date: 05/13/2021 ?Reason for consult: Mother's request;Primapara;Term ?Age:29 hours ? ?Mom decided to use our Medela Symphony pump. Parents were shown how to use DEBP & how to disassemble, clean, & reassemble parts. Mom knows to pump q3h for 15 min (or about 8 times/day). ? ?Mom was observed pumping for the first few minutes. Size 24 flanges are appropriate at this time.  ? ? ?Lactation Tools Discussed/Used ?Tools: Pump;Flanges ?Flange Size: 24 ?Breast pump type: Double-Electric Breast Pump ?Pump Education: Setup, frequency, and cleaning ?Reason for Pumping: Mom wants to pump & BO ?Pumping frequency: q3h ? ? ? ?Lurline Hare [place] ?05/13/2021, 1:05 PM ? ? ? ?

## 2021-05-13 NOTE — Progress Notes (Signed)
Received call from primary RN. Pre-syncopal after having ambulated to bathroom to void. Given her symptoms and Hgb of 6.5, recommend transfusion of 2u pRBCs. Patient was open to this earlier today. Blood transfusion orders placed.] ? ?Steva Ready, DO ? ?

## 2021-05-13 NOTE — Progress Notes (Signed)
Received call from primary RN previously regarding critical Hgb 6.5. I called patient directly and discussed results. We reviewed option for blood transfusion which patient was hesitant regarding. Discussed alternative which would be to receive an iron transfusion today although it can take some weeks to take effect. Counseled patient that if she is symptomatic once ambulating, recommend blood transfusion. IV Venofer 500mg  x 1 dose ordered.  ? ?CBC Latest Ref Rng & Units 05/13/2021 05/12/2021 05/01/2021  ?WBC 4.0 - 10.5 K/uL 14.1(H) 7.0 6.8  ?Hemoglobin 12.0 - 15.0 g/dL 6.5(LL) 9.3(L) 7.9(L)  ?Hematocrit 36.0 - 46.0 % 20.2(L) 29.7(L) 25.3(L)  ?Platelets 150 - 400 K/uL 206 228 208  ? ?07/01/2021, DO ? ?

## 2021-05-13 NOTE — Progress Notes (Signed)
Postpartum Note Day #1 ? ?S:  Patient doing well.  Pain controlled.  Tolerating regular diet. Has not really ambulated yet. Foley still in place. Patient reports mild dizziness with ambulation initially so planning to try again later.   Denies fevers, chills, chest pain, SOB, N/V, or worsening bilateral LE edema. ? ?Lochia: Minimal ?Infant feeding:  Breast ?Circumcision:  Desires prior to discharge ?Contraception:  None at this time ? ?O: Temp:  [97.7 ?F (36.5 ?C)-99.2 ?F (37.3 ?C)] 98.2 ?F (36.8 ?C) (03/17 0955) ?Pulse Rate:  [90-125] 107 (03/17 0955) ?Resp:  [14-29] 17 (03/17 0955) ?BP: (94-135)/(53-83) 100/53 (03/17 0955) ?SpO2:  [96 %-100 %] 98 % (03/17 0955) ?Gen: NAD, pleasant and cooperative ?CV: RRR ?Resp: CTAB, no wheezes/rales/rhonchi ?Abdomen: soft, non-distended, non-tender throughout ?Uterus: firm, non-tender, below umbilicus ?Incision: c/d/i, bandage in place  ?Ext: No bilateral LE edema, no bilateral calf tenderness ? ?Foley: ~100cc yellow urine ? ?Labs:  ? ?CBC Latest Ref Rng & Units 05/13/2021 05/12/2021 05/01/2021  ?WBC 4.0 - 10.5 K/uL 14.1(H) 7.0 6.8  ?Hemoglobin 12.0 - 15.0 g/dL 6.5(LL) 9.3(L) 7.9(L)  ?Hematocrit 36.0 - 46.0 % 20.2(L) 29.7(L) 25.3(L)  ?Platelets 150 - 400 K/uL 206 228 208  ? ? ? ?A/P: Patient is a 31 y.o. O0H2122 POD#0 s/p LTCS. ? ?S/p LTCS ?- Pain well controlled  ?- GU: UOP is adequate ?- GI: Tolerating regular diet ?- Activity: encouraged sitting up to chair and ambulation as tolerated ?- DVT Prophylaxis: SCDs, ambulation ?- Labs: as above ? ?Acute blood loss anemia ?- Hgb 6.5 from 9.3, Hct 20.2 f rom 29.7 ?- Bleeding currently minimal ?- Discussed risks/benefits of blood transfusion, declines at this time unless becomes symptomatic  ?- Currently receiving IV Venofer ? ?Circumcision consent: ?Routine circumcisions performed on newborns have been identified as voluntary, elective procedures by [employer] such as the [employer] [facility].  It is considered an  elective procedure with no definitive medical indication and carries risks. ? ?Risks include but are not limited to bleeding, infection, damage to penis with possible need for further surgery, poor cosmesis, and local anesthetic risks. ? ?Circumcision will only be performed if patient is deemed to have normal anatomy by his Pediatrician, meets adequate criteria for a newborn of similar gestational age after birth and is without infection or other medical issue contraindicating an elective procedure. ? ? ?Patient understands and agrees with above consent ?Patient discussed with parents of infant. ? ?Blood Consent: ?Patient was consented for blood products.  The patient is aware that bleeding may result in the need for a blood transfusion which includes risk of transmission of HIV (1:2 million), Hepatitis C (1:2 million), and Hepatitis B (1:200 thousand) and transfusion reaction.  Patient voiced understanding of the above risks as well as understanding of indications for blood transfusion. ? ? ? ?Disposition:  D/C home POD#2-3  ? ?Steva Ready, DO ?[phone] (office) ? ? ? ? ? ? ?

## 2021-05-14 DIAGNOSIS — Z98891 History of uterine scar from previous surgery: Secondary | ICD-10-CM

## 2021-05-14 DIAGNOSIS — D62 Acute posthemorrhagic anemia: Secondary | ICD-10-CM | POA: Diagnosis not present

## 2021-05-14 LAB — TYPE AND SCREEN
ABO/RH(D): O POS
Antibody Screen: NEGATIVE
Unit division: 0
Unit division: 0

## 2021-05-14 LAB — CBC
HCT: 22.4 % — ABNORMAL LOW (ref 36.0–46.0)
Hemoglobin: 7.4 g/dL — ABNORMAL LOW (ref 12.0–15.0)
MCH: 29.4 pg (ref 26.0–34.0)
MCHC: 33 g/dL (ref 30.0–36.0)
MCV: 88.9 fL (ref 80.0–100.0)
Platelets: 173 10*3/uL (ref 150–400)
RBC: 2.52 MIL/uL — ABNORMAL LOW (ref 3.87–5.11)
RDW: 17.9 % — ABNORMAL HIGH (ref 11.5–15.5)
WBC: 10.5 10*3/uL (ref 4.0–10.5)
nRBC: 0.7 % — ABNORMAL HIGH (ref 0.0–0.2)

## 2021-05-14 LAB — BPAM RBC
Blood Product Expiration Date: 202304092359
Blood Product Expiration Date: 202304102359
ISSUE DATE / TIME: 202303171755
ISSUE DATE / TIME: 202303172225
Unit Type and Rh: 5100
Unit Type and Rh: 5100

## 2021-05-14 MED ORDER — COMPLETENATE 29-1 MG PO CHEW
1.0000 | CHEWABLE_TABLET | Freq: Every day | ORAL | Status: DC
  Administered 2021-05-14: 1 via ORAL
  Filled 2021-05-14: qty 1

## 2021-05-14 MED ORDER — ACETAMINOPHEN 160 MG/5ML PO SOLN
1000.0000 mg | Freq: Four times a day (QID) | ORAL | Status: DC
  Administered 2021-05-14 – 2021-05-15 (×4): 1000 mg via ORAL
  Filled 2021-05-14 (×5): qty 40.6

## 2021-05-14 MED ORDER — IBUPROFEN 100 MG/5ML PO SUSP
600.0000 mg | Freq: Four times a day (QID) | ORAL | Status: DC
  Administered 2021-05-14 – 2021-05-15 (×4): 600 mg via ORAL
  Filled 2021-05-14 (×5): qty 30

## 2021-05-14 MED ORDER — IBUPROFEN 600 MG PO TABS
600.0000 mg | ORAL_TABLET | Freq: Four times a day (QID) | ORAL | 0 refills | Status: AC
Start: 2021-05-14 — End: ?

## 2021-05-14 MED ORDER — OXYCODONE HCL 5 MG PO TABS: 5.0000 mg | ORAL_TABLET | ORAL | 0 refills | Status: AC | PRN

## 2021-05-14 MED ORDER — POLYSACCHARIDE IRON COMPLEX 150 MG PO CAPS: 150.0000 mg | ORAL_CAPSULE | Freq: Every day | ORAL | 1 refills | Status: AC

## 2021-05-14 MED ORDER — POLYSACCHARIDE IRON COMPLEX 150 MG PO CAPS
150.0000 mg | ORAL_CAPSULE | Freq: Every day | ORAL | Status: DC
  Filled 2021-05-14 (×2): qty 1

## 2021-05-14 NOTE — Discharge Summary (Addendum)
?  [facility] DR Chelsea Small pt for [facility] OB Discharge Summary ? ?   ?Patient Name: Chelsea Small ?DOB: 1990/03/31 ?MRN: [id number] ? ?Date of admission: 05/12/2021 ?Delivering MD: Chelsea Small, Chelsea  ?Date of delivery: 05/12/2021 ?Type of delivery: PCS ? ?Newborn Data: ?Sex: Baby female ?Circumcision: circ done in pt ?Live born female  ?Birth Weight: 9 lb 3.8 oz (4190 g) ?APGAR: 8, 9 ? ?Newborn Delivery   ?Birth date/time: 05/[birth year]/2021 17:49:00 ?Delivery type: C-Section, Low Transverse ?Trial of labor: No ?C-section categorization: Primary ?  ?  ? ?Feeding: breast and bottle ?Infant being discharge to home with mother in stable condition.  ? ?Admitting diagnosis: Normal labor [O80, Z37.9] ?Intrauterine pregnancy: [redacted]w[redacted]d     ?Secondary diagnosis:  Principal Problem: ?  Normal labor ?Active Problems: ?  S/P cesarean section ?  Postpartum hemorrhage ?  Acute blood loss anemia ?  Normal postpartum course ?  Prolapsed cord ?                              ? ?Complications: [id number]                                                              ?Intrapartum Procedures: cesarean: low cervical, transverse ?Postpartum Procedures: transfusion x1 IV venofer and x2 units RBC ?Complications-Operative and Postpartum: hemorrhage  ?Augmentation: AROM and Pitocin ? ? ?History of Present Illness: ?Ms. Chelsea Small is a 31 y.o. female, [id number], who presents at [redacted]w[redacted]d weeks gestation. The patient has been followed at  [facility]  ?Her pregnancy has been complicated by: ? ?Patient Active Problem List  ? Diagnosis Date Noted  ? S/P cesarean section 05/14/2021  ? Postpartum hemorrhage 05/14/2021  ? Acute blood loss anemia 05/14/2021  ? Normal postpartum course 05/14/2021  ? Prolapsed cord 05/14/2021  ? Normal labor 05/12/2021  ?  ? ?Active Ambulatory Problems  ?  Diagnosis Date Noted  ? No Active Ambulatory Problems  ? ?Resolved Ambulatory Problems  ?  Diagnosis Date Noted  ? N&V (nausea and vomiting) 02/11/2014  ?  Hyperemesis gravidarum 02/12/2014  ? Nausea and vomiting 02/12/2014  ? Abdominal pain, epigastric 02/18/2014  ? Elevated amylase and lipase 02/18/2014  ? Active labor at term 09/01/2014  ? Nausea and vomiting during pregnancy 10/04/2020  ? ?Past Medical History:  ?Diagnosis Date  ? Anxiety   ? Chlamydia   ? Eczema   ?  ? ?Hospital course:  Induction of Labor With Cesarean Section   ?31 y.o. yo [id number] at 106w4d was admitted to the hospital 05/12/2021 for induction of labor. Patient had a labor course significant for see below. The patient went for cesarean section due to Cord Prolapse. Delivery details are as follows: ?Membrane Rupture Time/Date: 5:35 PM ,05/12/2021   ?Delivery Method:C-Section, Low Transverse  ?Details of operation can be found in separate operative Note.  Patient had an uncomplicated postpartum course. She is ambulating, tolerating a regular diet, passing flatus, and urinating well.  Patient is discharged home in stable condition on 05/14/21.     ? ?Newborn Data: ?Birth date:05/12/2021  ?Birth time:5:49 PM  ?Gender:Female  ?Living status:Living  ?Apgars:8 ,9  ?Weight:4190 g                               ? ?  Hospital Course--Unscheduled Cesarean:  ?Admitted 3/16. For elective IOL and progressed with pitocin then AROM, and had cord prolapse with fetal bradycardia. GBS negative.  Utilized epidural for pain management.  Due to cord prolapse, she was consented for cesarean, with Dr. Delora Small performing a primary LTCS under sepidural anesthesia, with delivery of a viable baby female, with weight and Apgars as listed below. Infant was in good condition and remained at the patient's bedside.  The patient was taken to recovery in good condition.  Patient planned to breast and bottle feed.  On post-op day 1, patient was doing well, tolerating a regular diet, pt had qbl during surgery of 1150mls with gb of 9.3, pt was given IV venofer on 3/17, then had another 500mg  loss with dizziness and near syncope episode, pt hgb  was then 6.5, pt later on 3/17 was transfused 2 units of blood and tolerated well, post hgb was 7.4, pt endorses today feeling much better, denies dizziness and desires to go home.  Throughout her stay, her physical exam was WNL, her incision was CDI, and her vital signs remained stable.  By post-op day 2, she was up ad lib, tolerating a regular diet, with good pain control with po med.  She was deemed to have received the full benefit of her hospital stay, and was discharged home in stable condition.  Contraceptive choice was possible nexplanon at PPV.    ? ?Physical exam  ?Vitals:  ? 05/13/21 2232 05/13/21 2251 05/14/21 0104 05/14/21 0618  ?BP: (!) 101/54 (!) 98/53 (!) 96/51 106/60  ?Pulse: 100 (!) 102 98 (!) 102  ?Resp: 18 17 16 16   ?Temp: 100 ?F (37.8 ?C) 99.6 ?F (37.6 ?C) 98.7 ?F (37.1 ?C) 98.4 ?F (36.9 ?C)  ?TempSrc: Axillary Axillary Oral Oral  ?SpO2: 100% 100% 99% 99%  ?Weight:      ?Height:      ? ?General: alert, cooperative, and no distress ?Lochia: appropriate ?Uterine Fundus: firm ?Incision: Healing well with no significant drainage, No significant erythema, Dressing is clean, dry, and intact, honeycomb dressing CDI ?Perineum: intact ?DVT Evaluation: No evidence of DVT seen on physical exam. ?Negative Homan's sign. ?No cords or calf tenderness. ?No significant calf/ankle edema. ? ?Labs: ?Lab Results  ?Component Value Date  ? WBC 10.5 05/14/2021  ? HGB 7.4 (L) 05/14/2021  ? HCT 22.4 (L) 05/14/2021  ? MCV 88.9 05/14/2021  ? PLT 173 05/14/2021  ? ?CMP Latest Ref Rng & Units 05/01/2021  ?Glucose 70 - 99 mg/dL 82  ?BUN 6 - 20 mg/dL 5(L)  ?Creatinine 0.44 - 1.00 mg/dL 0.49  ?Sodium 135 - 145 mmol/L 133(L)  ?Potassium 3.5 - 5.1 mmol/L 3.7  ?Chloride 98 - 111 mmol/L 103  ?CO2 22 - 32 mmol/L 19(L)  ?Calcium 8.9 - 10.3 mg/dL 8.4(L)  ?Total Protein 6.5 - 8.1 g/dL 6.9  ?Total Bilirubin 0.3 - 1.2 mg/dL 0.4  ?Alkaline Phos 38 - 126 U/L 118  ?AST 15 - 41 U/L 19  ?ALT 0 - 44 U/L 12  ? ? ?Date of discharge:  05/15/2021 ?Discharge Diagnoses: Term Pregnancy-delivered ?Discharge instruction: per After Visit Summary and "Baby and Me Booklet". ? ?After visit meds:  ? ?Activity:           unrestricted and pelvic rest Advance as tolerated. Pelvic rest for 6 weeks.  ?Diet:                routine ?Medications: PNV, Ibuprofen, Colace, and Iron, OXY IR ?Postpartum contraception: Nexplanon ?Condition:  Pt discharge to home with baby in stable ?ABLA: PO Iron.  ? ?Meds: ?Allergies as of 05/15/2021   ?No Known Allergies ?  ? ?  ?Medication List  ?  ? ?STOP taking these medications   ? ?ondansetron 8 MG disintegrating tablet ?Commonly known as: ZOFRAN-ODT ?  ?promethazine 25 MG tablet ?Commonly known as: PHENERGAN ?  ? ?  ? ?TAKE these medications   ? ?acetaminophen 500 MG tablet ?Commonly known as: TYLENOL ?Take 1,000 mg by mouth every 6 (six) hours as needed for mild pain or headache. ?What changed: Another medication with the same name was added. Make sure you understand how and when to take each. ?  ?acetaminophen 160 MG/5ML solution ?Commonly known as: TYLENOL ?Take 31.3 mLs (1,000 mg total) by mouth every 6 (six) hours. ?What changed: You were already taking a medication with the same name, and this prescription was added. Make sure you understand how and when to take each. ?  ?ferrous sulfate 300 (60 Fe) MG/5ML syrup ?Take 5 mLs (300 mg total) by mouth daily with breakfast. ?  ?ibuprofen 600 MG tablet ?Commonly known as: ADVIL ?Take 1 tablet (600 mg total) by mouth every 6 (six) hours. ?  ?ibuprofen 100 MG/5ML suspension ?Commonly known as: ADVIL ?Take 30 mLs (600 mg total) by mouth every 6 (six) hours. ?  ?iron polysaccharides 150 MG capsule ?Commonly known as: NIFEREX ?Take 1 capsule (150 mg total) by mouth daily. ?  ?oxyCODONE 5 MG immediate release tablet ?Commonly known as: Oxy IR/ROXICODONE ?Take 1 tablet (5 mg total) by mouth every 4 (four) hours as needed for moderate pain. ?  ?prenatal multivitamin Tabs tablet ?Take 1  tablet by mouth daily at 12 noon. ?  ? ?  ? ?  ?  ? ? ?  ?Discharge Care Instructions  ?(From admission, onward)  ?  ? ? ?  ? ?  Start     Ordered  ? 05/14/21 0000  Discharge wound care:       ?Comments: Take dressin

## 2021-05-14 NOTE — Lactation Note (Signed)
This note was copied from a baby's chart. ?Lactation Consultation Note ? ?Patient Name: Chelsea Small ?Today's Date: 05/14/2021 ?  ?Age:29 hours ? ?LC visit attempted, but Mom was sleeping.  ? ? ?Lurline Hare [place] ?05/14/2021, 1:47 PM ? ? ? ?

## 2021-05-15 MED ORDER — FERROUS SULFATE 300 (60 FE) MG/5ML PO SYRP
300.0000 mg | ORAL_SOLUTION | Freq: Every day | ORAL | 3 refills | Status: AC
Start: 2021-05-15 — End: ?

## 2021-05-15 MED ORDER — IBUPROFEN 100 MG/5ML PO SUSP
600.0000 mg | Freq: Four times a day (QID) | ORAL | 0 refills | Status: AC
Start: 2021-05-15 — End: 2021-06-14

## 2021-05-15 MED ORDER — ACETAMINOPHEN 160 MG/5ML PO SOLN: 1000.0000 mg | Freq: Four times a day (QID) | ORAL | 0 refills | Status: AC

## 2021-05-15 MED ORDER — FERROUS SULFATE 300 (60 FE) MG/5ML PO SYRP
300.0000 mg | ORAL_SOLUTION | Freq: Every day | ORAL | Status: DC
  Administered 2021-05-15: 300 mg via ORAL
  Filled 2021-05-15: qty 5

## 2021-05-15 NOTE — Lactation Note (Signed)
This note was copied from a baby's chart. ?Lactation Consultation Note ? ?Patient Name: Chelsea Small ?Today's Date: 05/15/2021 ?Reason for consult: Follow-up assessment ?Age:29 hours ? ? ?P2 mother whose infant is now 66 hours old.  This is a term baby at 39+4 weeks.  Mother's current feeding preference is to pump and bottle feed her EBM.  She is only providing formula at this time.   ? ?Mother has not been pumping.  Mother verbalized that she was waiting until her milk "came in" to begin pumping.  Education completed on supply and demand, breast massage, hand expression, pumping frequency and consistency and how to assess flange size.  Currently the #27 flange sizes are appropriate.  Mother taught how to correctly assess flange size.   ? ?Mother's breasts are very large and heavy.  Father assisting to hold flange on one breast while mother holds the alternate flange.  Discussed using a sports bra or a "hands free" bra with pumping; mother interested.  Family has our OP phone number for any questions/concerns after discharge.  Mother aware to always feed back her EBM prior to giving formula.  Suggested increased volumes to 30+ mls today.  Parents verbalized understanding.  RN updated. ? ? ? ?Maternal Data ?  ? ?Feeding ?Nipple Type: Slow - flow ? ?LATCH Score ?  ? ?  ? ?  ? ?  ? ?  ? ?  ? ? ?Lactation Tools Discussed/Used ?  ? ?Interventions ?  ? ?Discharge ?Discharge Education: Engorgement and breast care ?Pump: DEBP;Manual;Personal ? ?Consult Status ?Consult Status: Complete ?Date: 05/15/21 ?Follow-up type: Call as needed ? ? ? ?Saafir Abdullah R Sadeel Fiddler ?05/15/2021, 9:37 AM ? ? ? ?

## 2021-05-23 ENCOUNTER — Telehealth (HOSPITAL_COMMUNITY): Payer: Self-pay | Admitting: *Deleted

## 2021-05-23 NOTE — Telephone Encounter (Signed)
Left phone voicemail message. ? ?Duffy Rhody, RN 05-23-2021 at 1:27pm ?

## 2021-06-27 DIAGNOSIS — Z30017 Encounter for initial prescription of implantable subdermal contraceptive: Secondary | ICD-10-CM | POA: Diagnosis not present

## 2021-06-27 DIAGNOSIS — Z3202 Encounter for pregnancy test, result negative: Secondary | ICD-10-CM | POA: Diagnosis not present

## 2022-06-04 IMAGING — US US OB COMP LESS 14 WK
1 series · 15 of 28 positions shown · non-contrast
Comparison: None.

CLINICAL DATA: Confirm viability, hyperemesis

EXAM:
OBSTETRIC <14 WK ULTRASOUND
TECHNIQUE: Transabdominal ultrasound was performed for evaluation of the
gestation as well as the maternal uterus and adnexal regions.

[Series 1: us ob comp less 14 wk · 36 acquisitions, 15 frames shown]
[im 1/36]
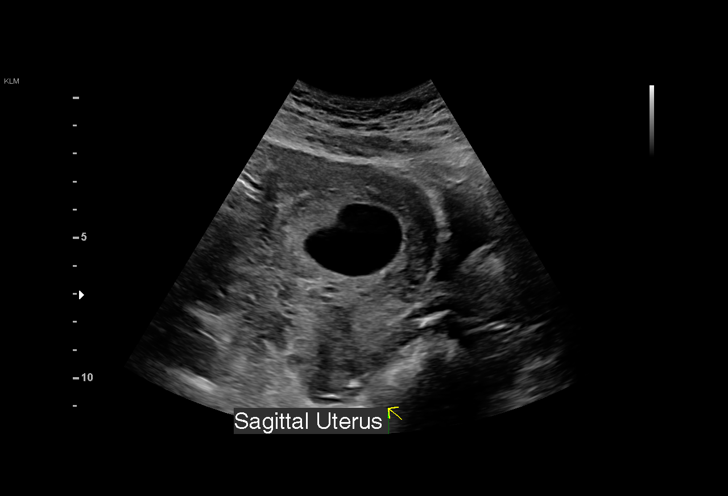
[im 3/36]
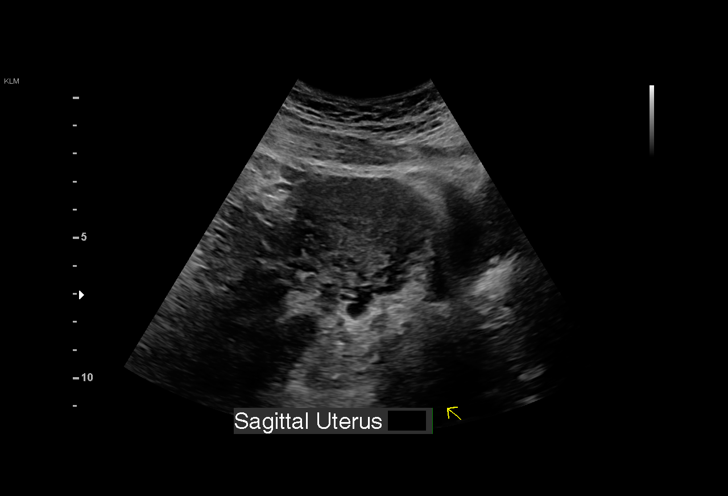
[im 6/36]
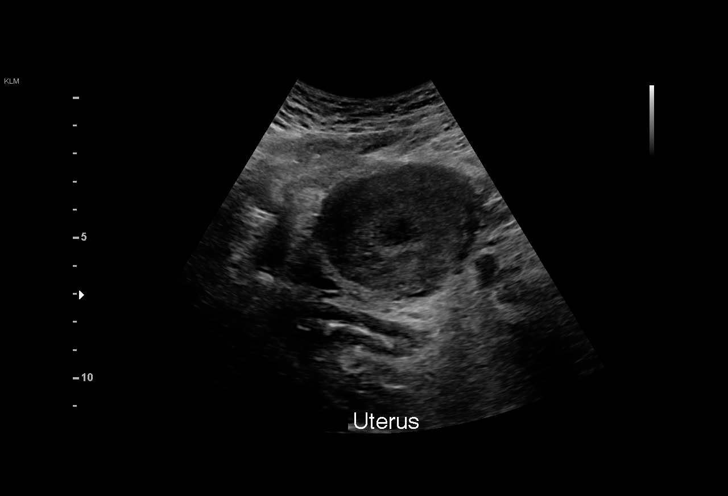
[im 8/36]
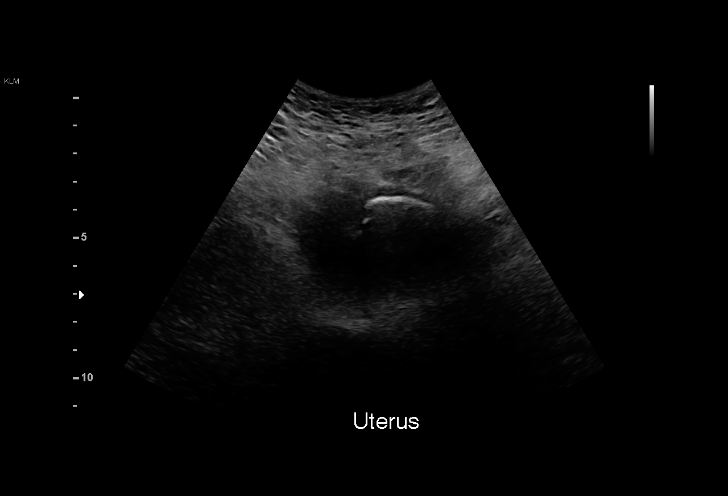
[im 11/36]
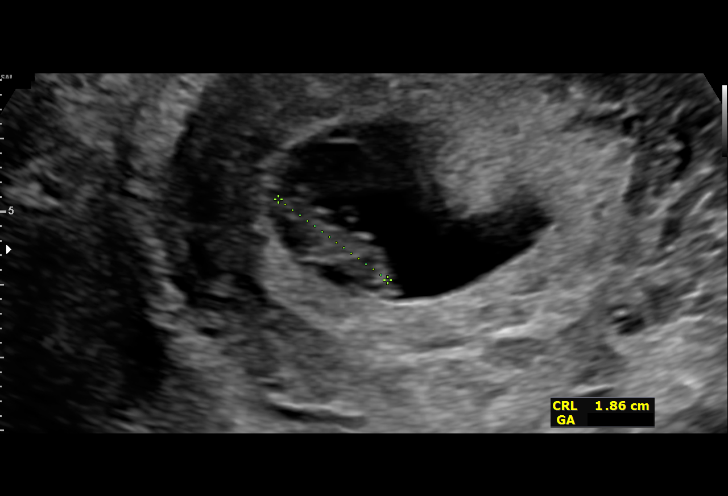
[im 13/36]
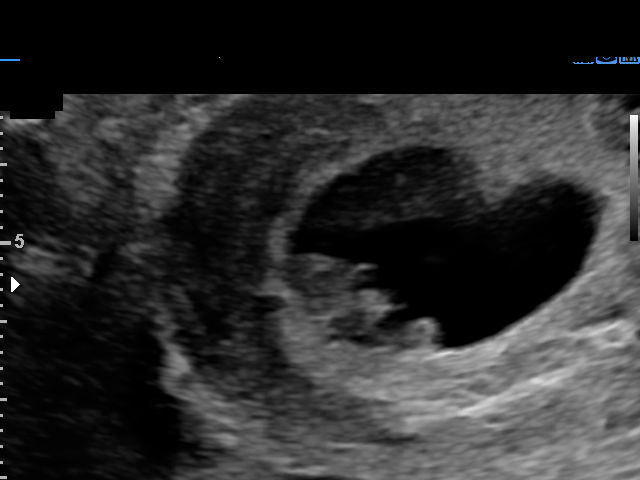
[im 16/36]
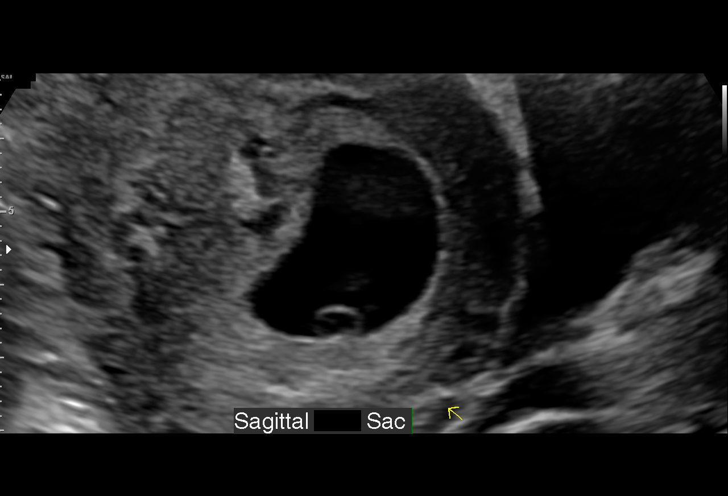
[im 19/36]
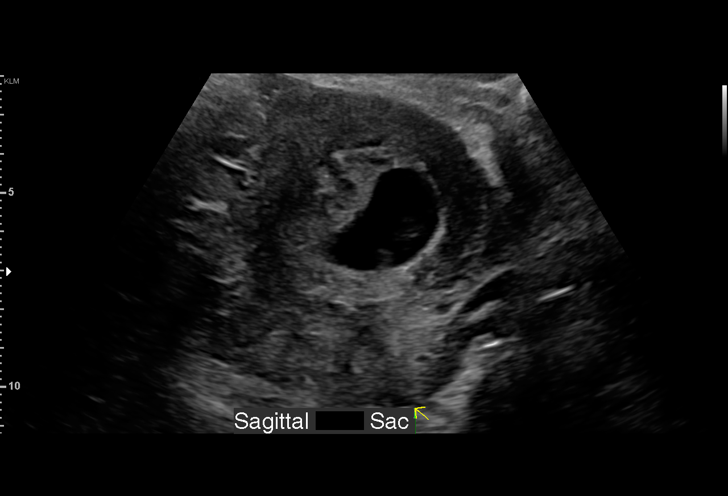
[im 20/36]
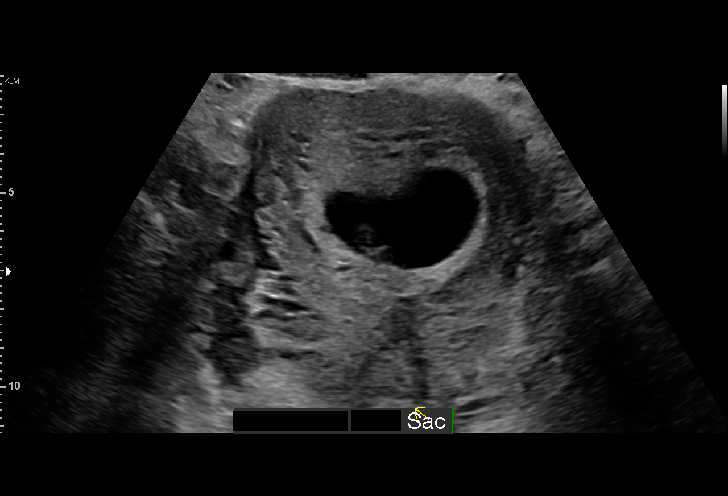
[im 23/36]
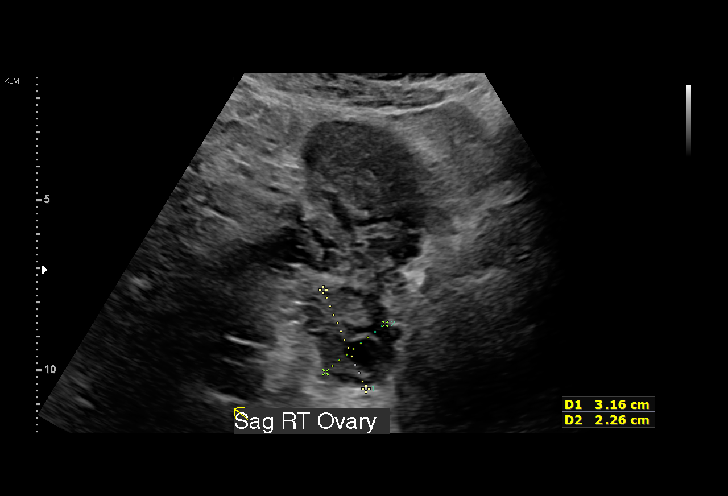
[im 25/36]
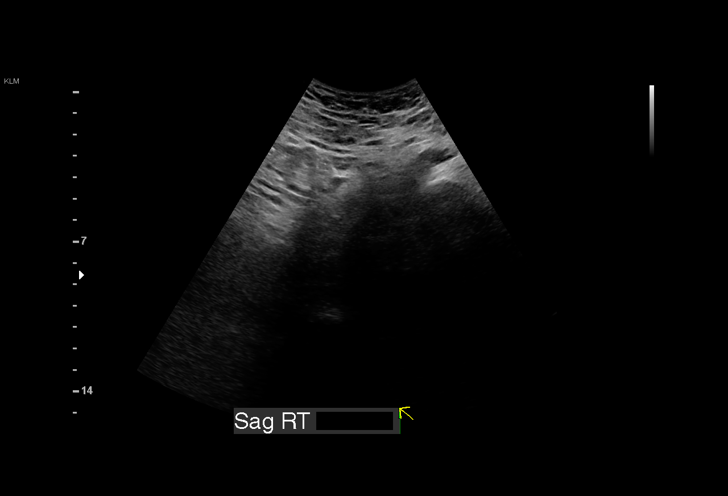
[im 28/36]
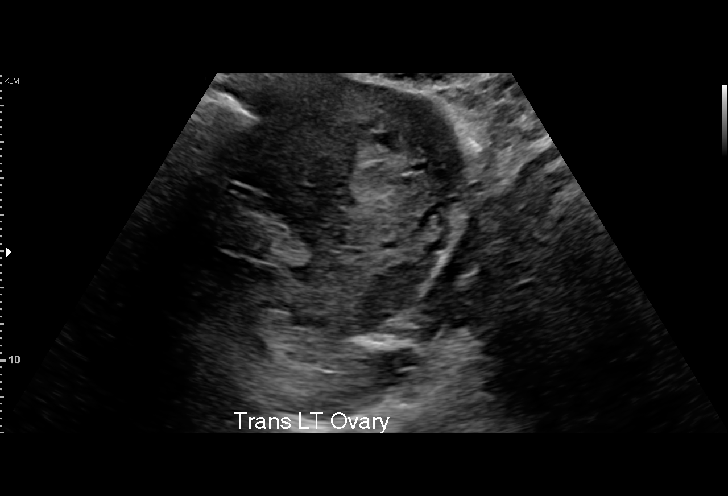
[im 30/36]
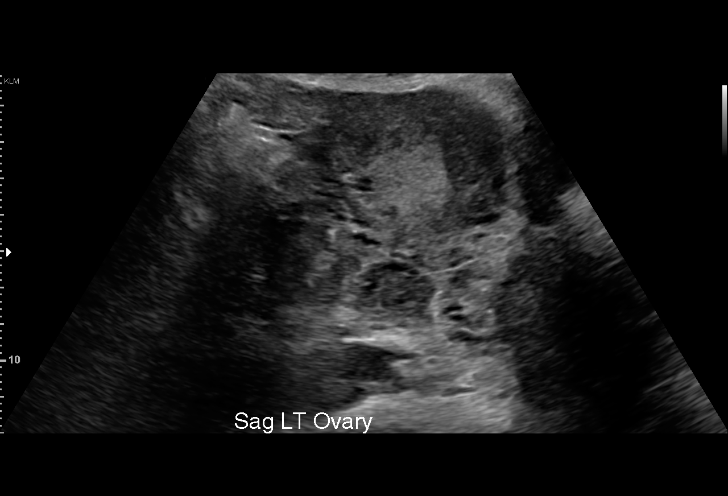
[im 33/36]
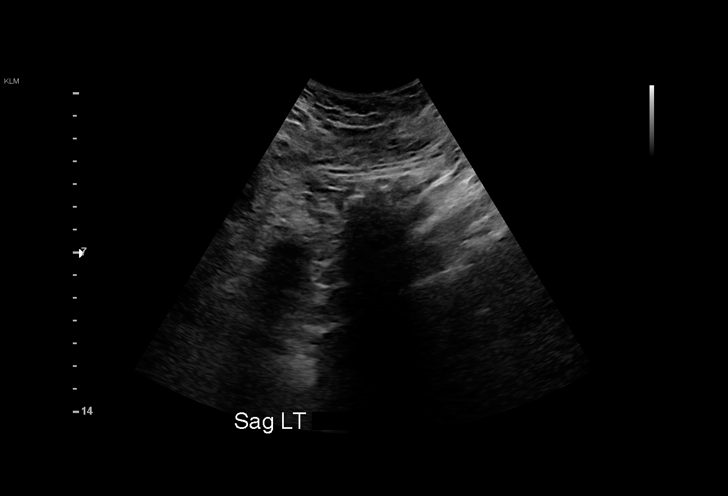
[im 36/36]
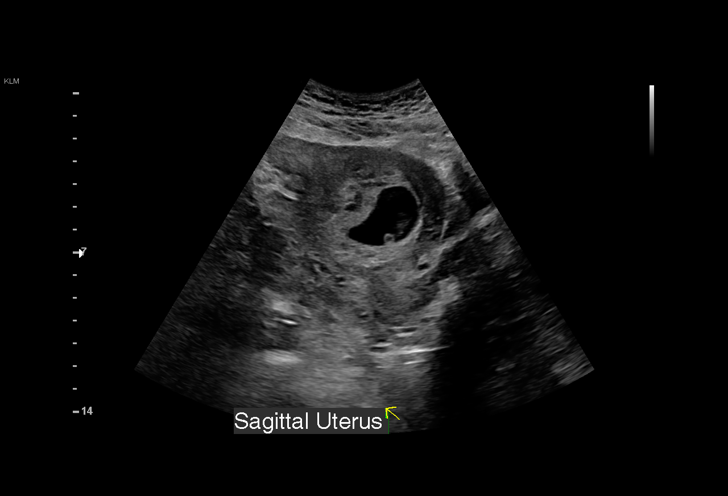

[15 of 28 positions shown; findings below may reference images not displayed]

FINDINGS: Intrauterine gestational sac: Single

Yolk sac:  Visualized.

Embryo:  Visualized.

Cardiac Activity: Visualized.

Heart Rate: 176 bpm

CRL: 18.5 mm   8 w 2 d                  US EDC: 05/14/2021

Subchorionic hemorrhage:  None visualized.

Maternal uterus/adnexae: No worrisome abnormality of the maternal
uterus normal appearance of the ovaries with a small right corpus
luteum. No free pelvic fluid.
IMPRESSION: Single viable intrauterine gestation at 8 weeks, 2 days by
crown-rump length sonographic estimation.

No acute sonographic complication.

## 2022-12-30 IMAGING — DX DG CHEST 1V PORT
1 series · 1 of 1 positions shown · non-contrast
Comparison: 08/20/2016.

CLINICAL DATA: Pregnancy with fever and shortness of breath.

EXAM:
PORTABLE CHEST 1 VIEW

[chest]
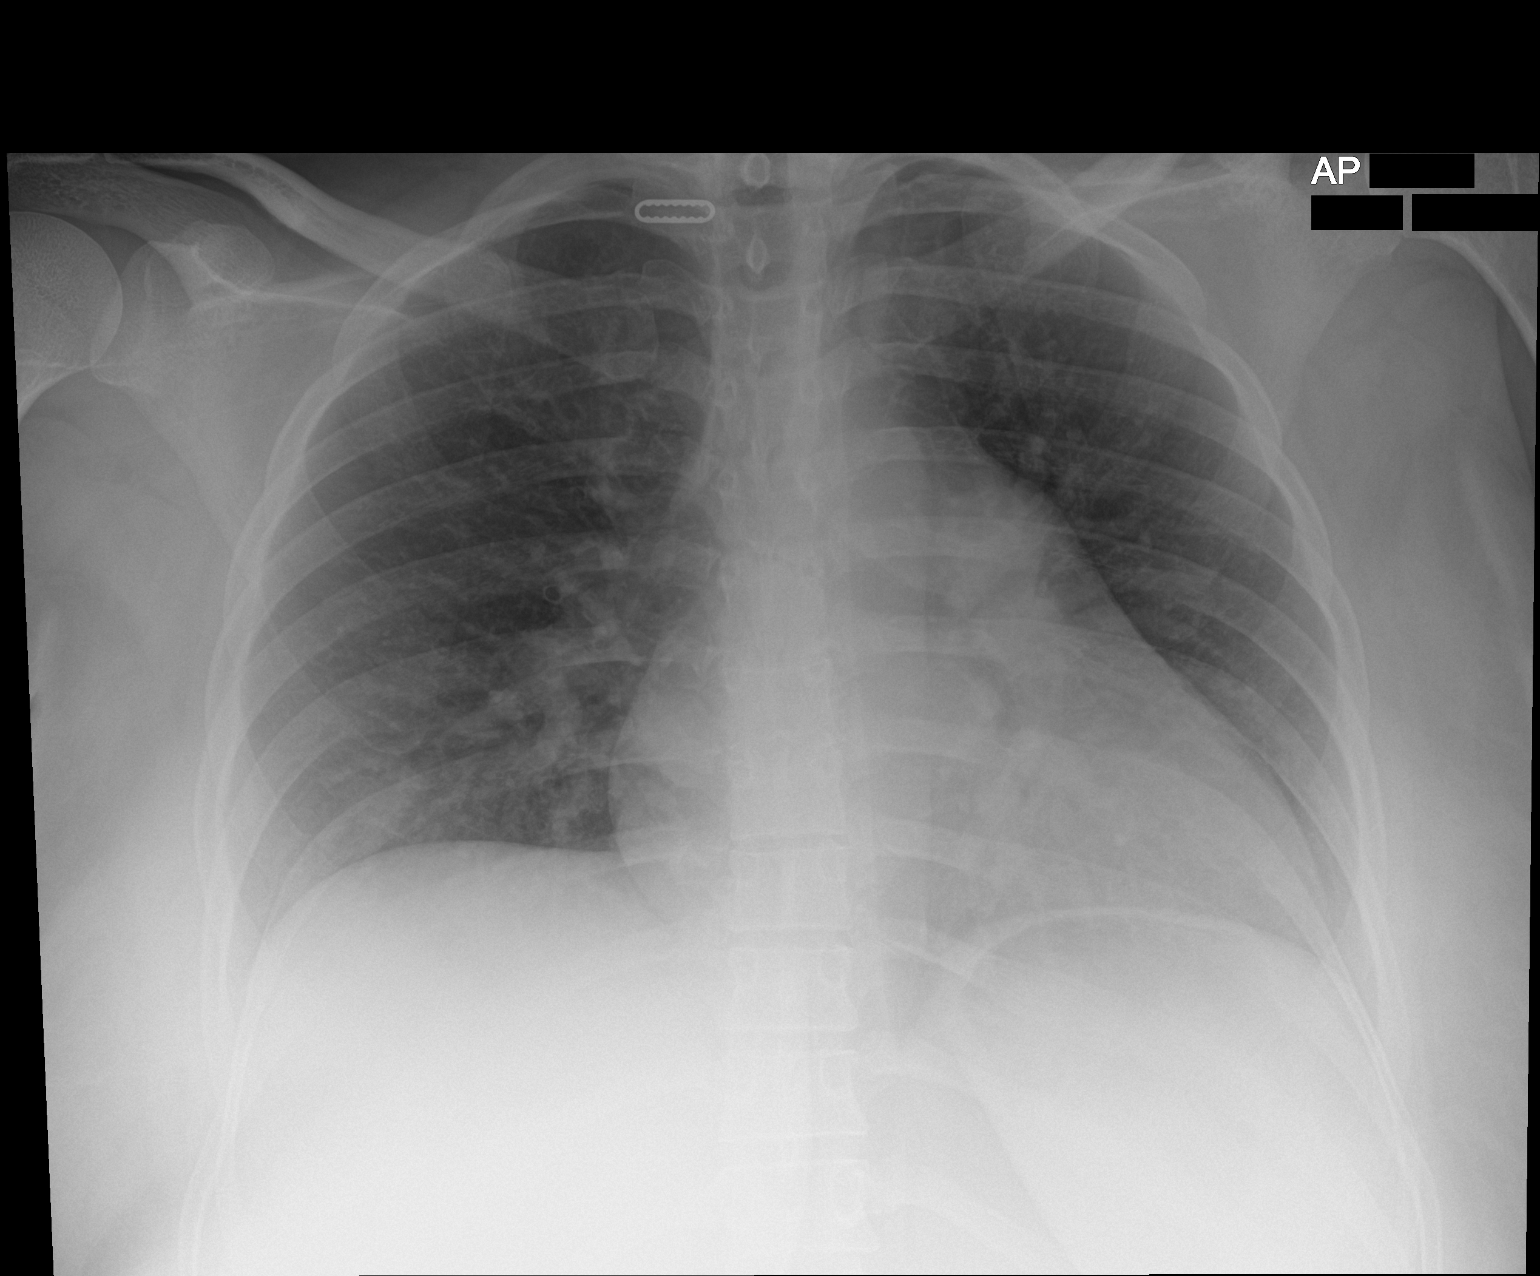

[1 of 1 positions shown; findings below may reference images not displayed]

FINDINGS: The heart is enlarged and the mediastinal contour is within normal
limits. No consolidation, effusion, or pneumothorax. No acute
osseous abnormality.
IMPRESSION: Cardiomegaly with no acute disease process.

## 2023-03-07 DIAGNOSIS — Z862 Personal history of diseases of the blood and blood-forming organs and certain disorders involving the immune mechanism: Secondary | ICD-10-CM | POA: Diagnosis not present

## 2023-03-07 DIAGNOSIS — R0683 Snoring: Secondary | ICD-10-CM | POA: Diagnosis not present

## 2023-03-07 DIAGNOSIS — Z975 Presence of (intrauterine) contraceptive device: Secondary | ICD-10-CM | POA: Diagnosis not present

## 2023-03-07 DIAGNOSIS — Z0000 Encounter for general adult medical examination without abnormal findings: Secondary | ICD-10-CM | POA: Diagnosis not present

## 2023-03-07 DIAGNOSIS — Z1331 Encounter for screening for depression: Secondary | ICD-10-CM | POA: Diagnosis not present

## 2023-03-23 DIAGNOSIS — Z09 Encounter for follow-up examination after completed treatment for conditions other than malignant neoplasm: Secondary | ICD-10-CM | POA: Diagnosis not present

## 2023-03-23 DIAGNOSIS — Z862 Personal history of diseases of the blood and blood-forming organs and certain disorders involving the immune mechanism: Secondary | ICD-10-CM | POA: Diagnosis not present

## 2023-03-23 DIAGNOSIS — Z13220 Encounter for screening for lipoid disorders: Secondary | ICD-10-CM | POA: Diagnosis not present

## 2023-03-23 DIAGNOSIS — Z131 Encounter for screening for diabetes mellitus: Secondary | ICD-10-CM | POA: Diagnosis not present

## 2023-05-03 DIAGNOSIS — G4733 Obstructive sleep apnea (adult) (pediatric): Secondary | ICD-10-CM | POA: Diagnosis not present

## 2024-01-08 DIAGNOSIS — G4733 Obstructive sleep apnea (adult) (pediatric): Secondary | ICD-10-CM | POA: Diagnosis not present

## 2024-01-08 DIAGNOSIS — R519 Headache, unspecified: Secondary | ICD-10-CM | POA: Diagnosis not present

## 2024-01-08 DIAGNOSIS — F439 Reaction to severe stress, unspecified: Secondary | ICD-10-CM | POA: Diagnosis not present
